# Patient Record
Sex: Male | Born: 1960 | Race: Black or African American | Hispanic: No | Marital: Married | Smoking: Former smoker
Health system: Southern US, Community
[De-identification: ages and names within clinical notes are randomized; demographics above are authoritative.]

## PROBLEM LIST (undated history)

## (undated) DIAGNOSIS — R030 Elevated blood-pressure reading, without diagnosis of hypertension: Secondary | ICD-10-CM

## (undated) DIAGNOSIS — G4730 Sleep apnea, unspecified: Secondary | ICD-10-CM

## (undated) HISTORY — DX: Elevated blood-pressure reading, without diagnosis of hypertension: R03.0

## (undated) HISTORY — PX: NO PAST SURGERIES: SHX2092

## (undated) HISTORY — DX: Sleep apnea, unspecified: G47.30

---

## 2004-05-17 ENCOUNTER — Emergency Department (HOSPITAL_COMMUNITY): Admission: EM | Admit: 2004-05-17 | Discharge: 2004-05-17 | Payer: Self-pay | Admitting: Family Medicine

## 2006-10-05 ENCOUNTER — Encounter: Admission: RE | Admit: 2006-10-05 | Discharge: 2006-10-05 | Payer: Self-pay | Admitting: Internal Medicine

## 2008-02-23 IMAGING — US US RENAL
1 series · 14 of 25 positions shown · non-contrast
Comparison: None.

CLINICAL DATA: 45 year old male with hematuria.  
 RENAL ULTRASOUND:
TECHNIQUE: Complete ultrasound examination of the urinary tract was performed including evaluation of the kidneys, renal collecting systems, and urinary bladder.

[Series 1: unknown · 0.33mm/px · 14 of 42 slices shown]
[im 1/42]
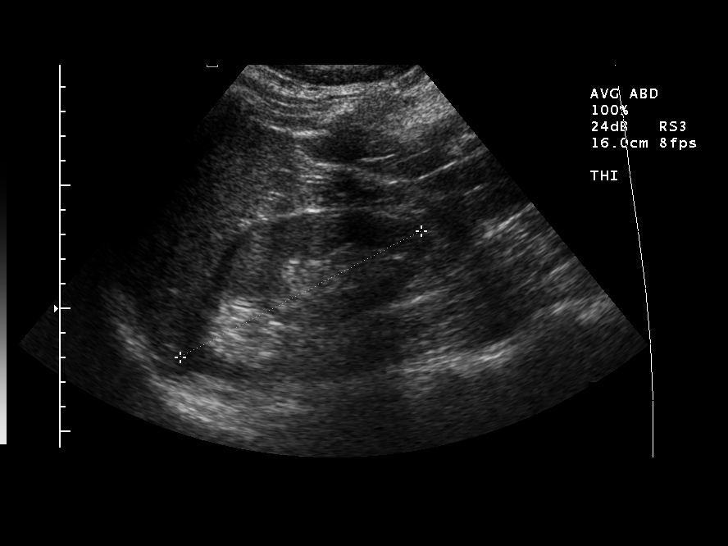
[im 4/42]
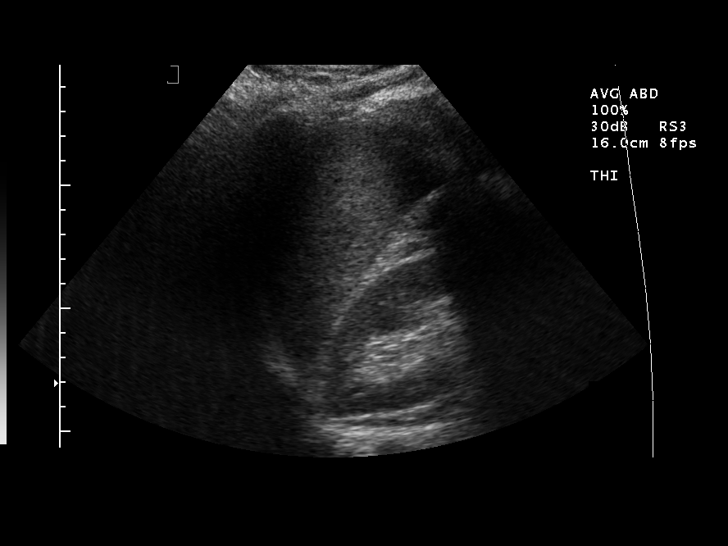
[im 7/42]
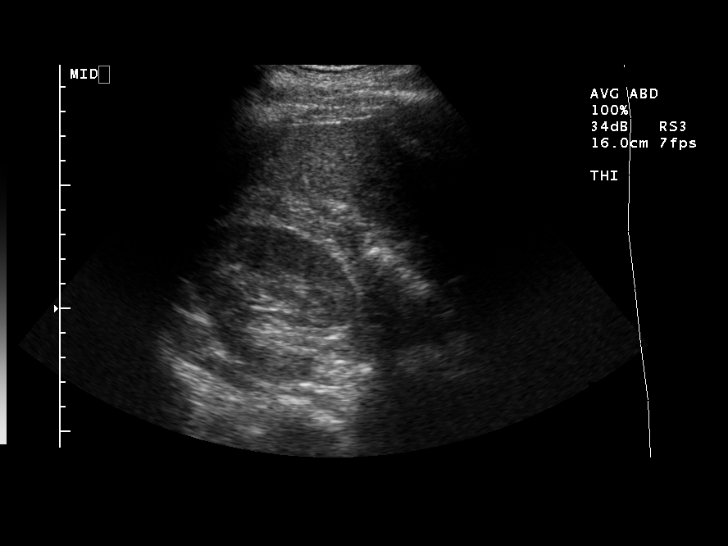
[im 11/42]
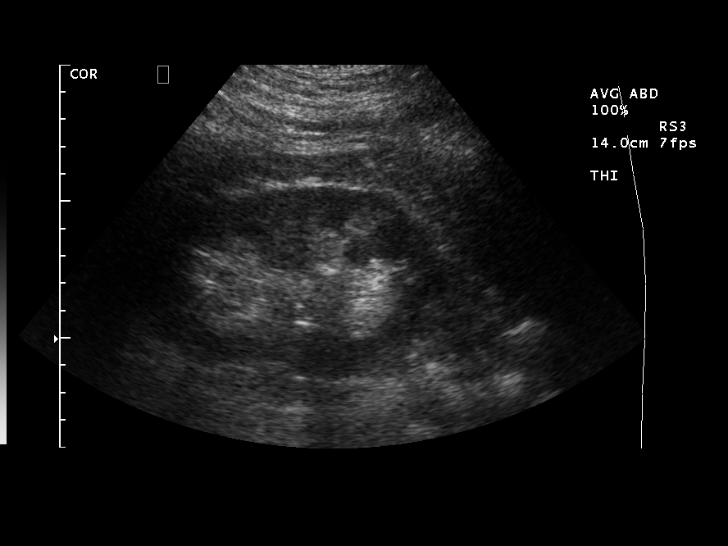
[im 14/42]
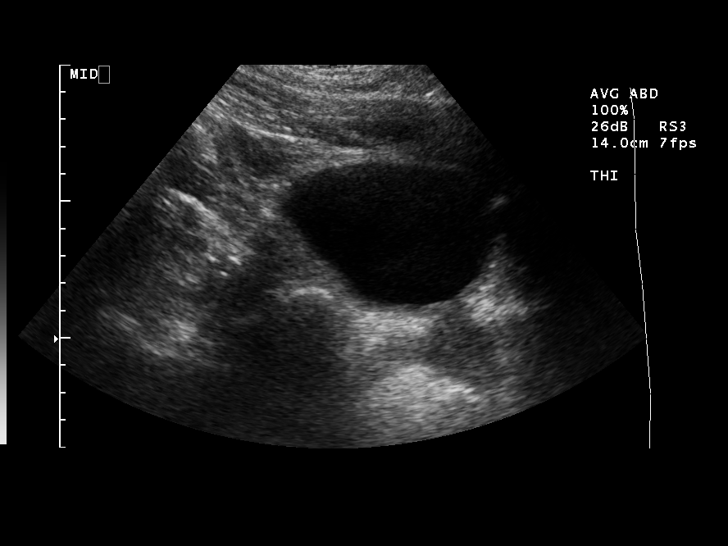
[im 16/42]
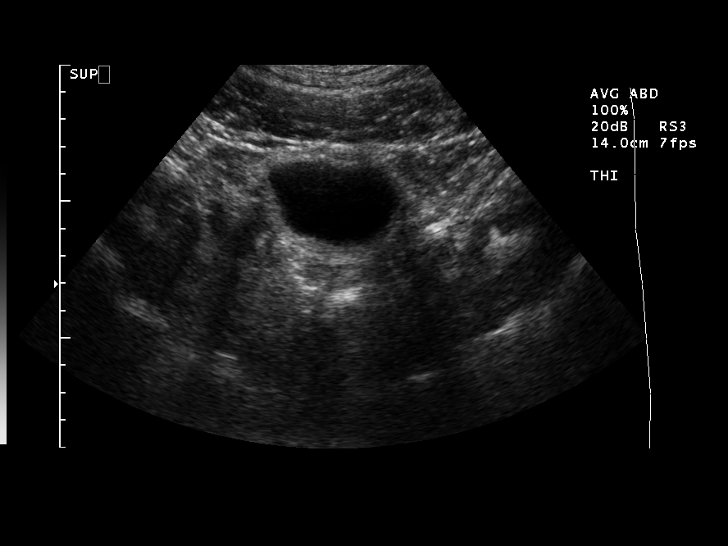
[im 19/42]
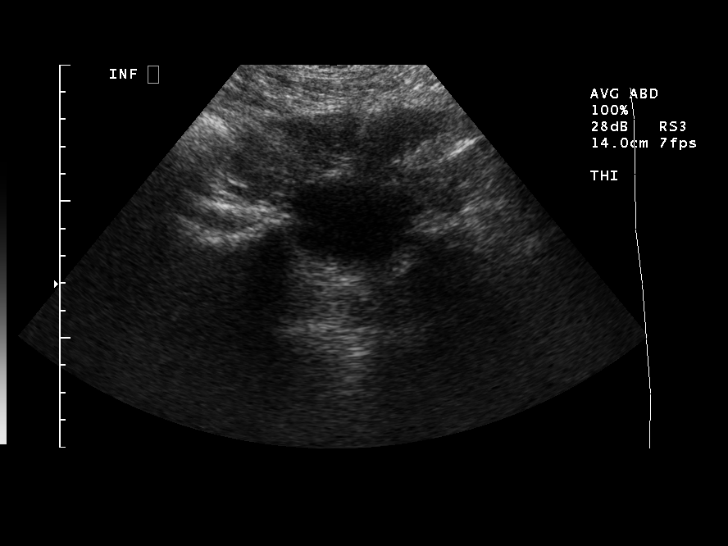
[im 23/42]
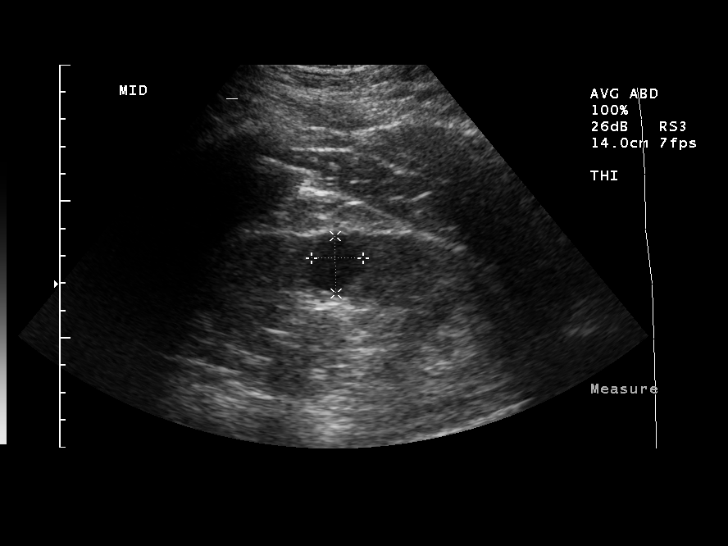
[im 26/42]
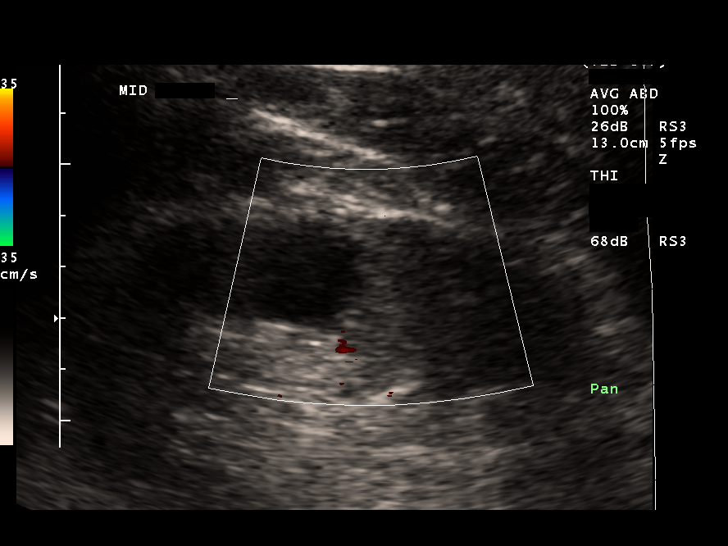
[im 28/42]
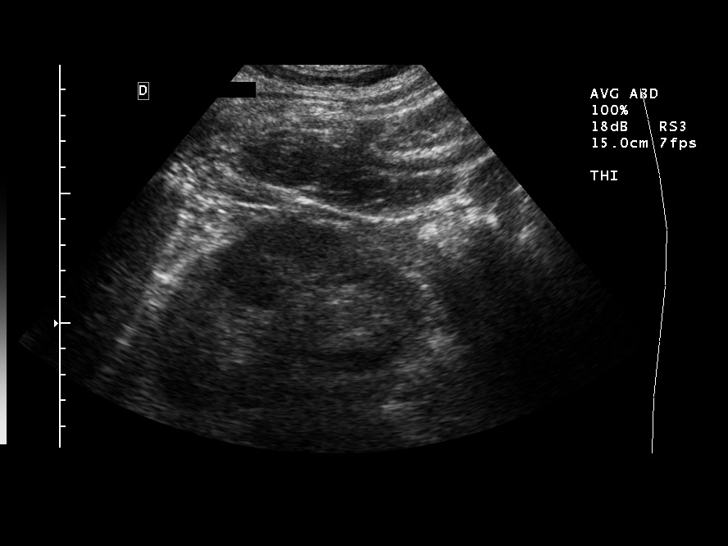
[im 31/42]
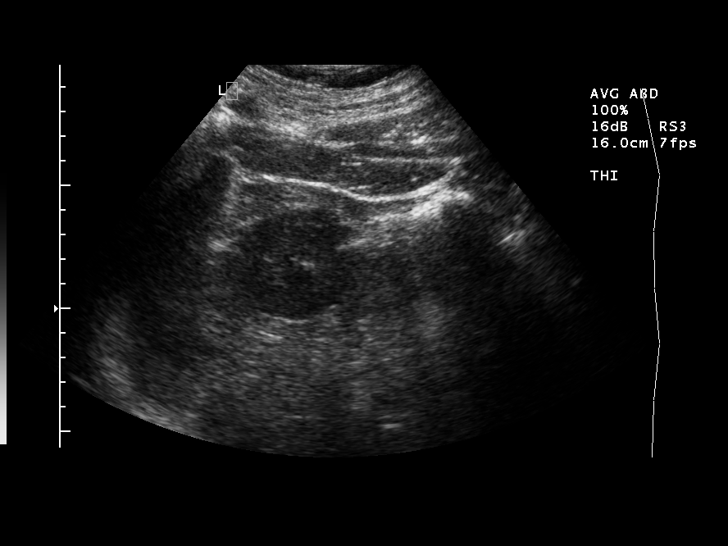
[im 35/42]
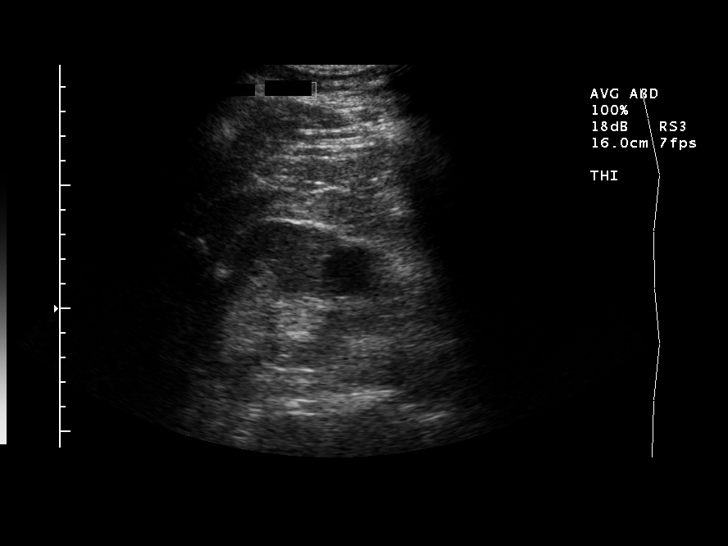
[im 38/42]
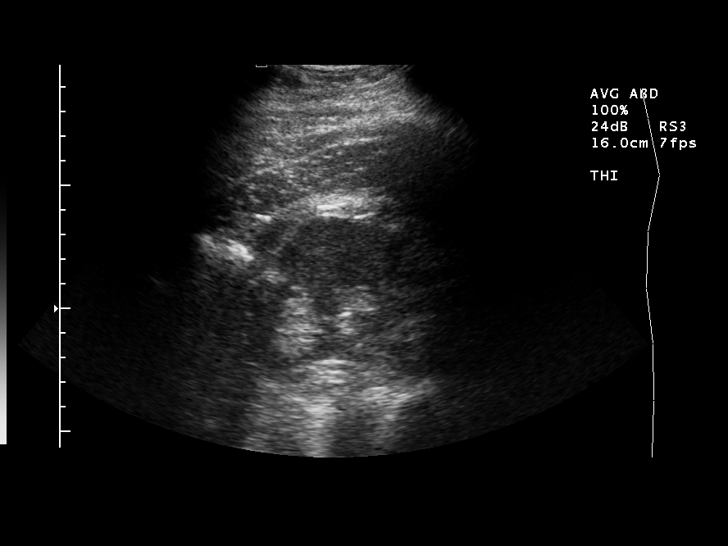
[im 42/42]
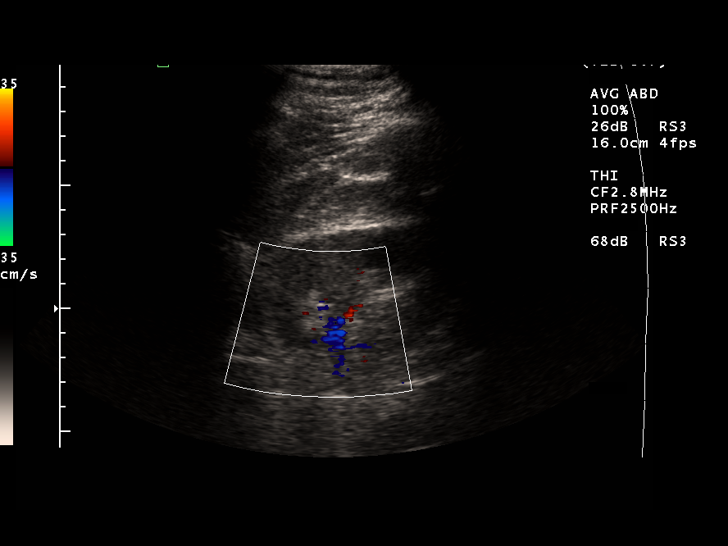

[14 of 25 positions shown; findings below may reference images not displayed]

FINDINGS: Right kidney appears normal measuring 11cm.  The left kidney measures 11.6cm.   No renal obstruction, hydronephrosis, or perinephric fluid collection.  The left kidney contains a mid to upper pole hypoechoic cyst measuring 19 x 21 x 22mm.  Imaging of the bladder is unremarkable.
IMPRESSION: 1.  2cm left renal cyst.  No acute finding by ultrasound.

## 2010-08-10 ENCOUNTER — Encounter: Payer: Self-pay | Admitting: Internal Medicine

## 2015-03-06 ENCOUNTER — Encounter (HOSPITAL_BASED_OUTPATIENT_CLINIC_OR_DEPARTMENT_OTHER): Payer: Self-pay

## 2015-03-06 ENCOUNTER — Emergency Department (HOSPITAL_BASED_OUTPATIENT_CLINIC_OR_DEPARTMENT_OTHER)
Admission: EM | Admit: 2015-03-06 | Discharge: 2015-03-06 | Discharge status: Against Medical Advice | Payer: BLUE CROSS/BLUE SHIELD | Attending: Emergency Medicine | Admitting: Emergency Medicine

## 2015-03-06 ENCOUNTER — Emergency Department (HOSPITAL_BASED_OUTPATIENT_CLINIC_OR_DEPARTMENT_OTHER): Payer: BLUE CROSS/BLUE SHIELD

## 2015-03-06 DIAGNOSIS — Z720 Tobacco use: Secondary | ICD-10-CM | POA: Insufficient documentation

## 2015-03-06 DIAGNOSIS — R202 Paresthesia of skin: Secondary | ICD-10-CM | POA: Diagnosis not present

## 2015-03-06 DIAGNOSIS — R079 Chest pain, unspecified: Secondary | ICD-10-CM | POA: Diagnosis not present

## 2015-03-06 LAB — BASIC METABOLIC PANEL
ANION GAP: 9 (ref 5–15)
BUN: 11 mg/dL (ref 6–20)
CHLORIDE: 106 mmol/L (ref 101–111)
CO2: 25 mmol/L (ref 22–32)
Calcium: 8.9 mg/dL (ref 8.9–10.3)
Creatinine, Ser: 0.73 mg/dL (ref 0.61–1.24)
GFR calc non Af Amer: >60 mL/min (ref >60)
Glucose, Bld: 92 mg/dL (ref 65–99)
POTASSIUM: 3.7 mmol/L (ref 3.5–5.1)
Sodium: 140 mmol/L (ref 135–145)

## 2015-03-06 LAB — CBC
HCT: 41.9 % (ref 39.0–52.0)
Hemoglobin: 14 g/dL (ref 13.0–17.0)
MCH: 32.4 pg (ref 26.0–34.0)
MCHC: 33.4 g/dL (ref 30.0–36.0)
MCV: 97 fL (ref 78.0–100.0)
Platelets: 252 10*3/uL (ref 150–400)
RBC: 4.32 MIL/uL (ref 4.22–5.81)
RDW: 13.3 % (ref 11.5–15.5)
WBC: 5.6 10*3/uL (ref 4.0–10.5)

## 2015-03-06 LAB — TROPONIN I

## 2015-03-06 NOTE — ED Notes (Signed)
Pt c/o lt side chest pain radiating down lt arm; denies n/v, SOB, diaphoresis; no distress and no pain at this time; pt states he is under a lot of stress

## 2015-03-06 NOTE — Discharge Instructions (Signed)
Chest Pain (Nonspecific) °It is often hard to give a specific diagnosis for the cause of chest pain. There is always a chance that your pain could be related to something serious, such as a heart attack or a blood clot in the lungs. You need to follow up with your health care provider for further evaluation. °CAUSES  °· Heartburn. °· Pneumonia or bronchitis. °· Anxiety or stress. °· Inflammation around your heart (pericarditis) or lung (pleuritis or pleurisy). °· A blood clot in the lung. °· A collapsed lung (pneumothorax). It can develop suddenly on its own (spontaneous pneumothorax) or from trauma to the chest. °· Shingles infection (herpes zoster virus). °The chest wall is composed of bones, muscles, and cartilage. Any of these can be the source of the pain. °· The bones can be bruised by injury. °· The muscles or cartilage can be strained by coughing or overwork. °· The cartilage can be affected by inflammation and become sore (costochondritis). °DIAGNOSIS  °Lab tests or other studies may be needed to find the cause of your pain. Your health care provider may have you take a test called an ambulatory electrocardiogram (ECG). An ECG records your heartbeat patterns over a 24-hour period. You may also have other tests, such as: °· Transthoracic echocardiogram (TTE). During echocardiography, sound waves are used to evaluate how blood flows through your heart. °· Transesophageal echocardiogram (TEE). °· Cardiac monitoring. This allows your health care provider to monitor your heart rate and rhythm in real time. °· Holter monitor. This is a portable device that records your heartbeat and can help diagnose heart arrhythmias. It allows your health care provider to track your heart activity for several days, if needed. °· Stress tests by exercise or by giving medicine that makes the heart beat faster. °TREATMENT  °· Treatment depends on what may be causing your chest pain. Treatment may include: °¨ Acid blockers for  heartburn. °¨ Anti-inflammatory medicine. °¨ Pain medicine for inflammatory conditions. °¨ Antibiotics if an infection is present. °· You may be advised to change lifestyle habits. This includes stopping smoking and avoiding alcohol, caffeine, and chocolate. °· You may be advised to keep your head raised (elevated) when sleeping. This reduces the chance of acid going backward from your stomach into your esophagus. °Most of the time, nonspecific chest pain will improve within 2-3 days with rest and mild pain medicine.  °HOME CARE INSTRUCTIONS  °· If antibiotics were prescribed, take them as directed. Finish them even if you start to feel better. °· For the next few days, avoid physical activities that bring on chest pain. Continue physical activities as directed. °· Do not use any tobacco products, including cigarettes, chewing tobacco, or electronic cigarettes. °· Avoid drinking alcohol. °· Only take medicine as directed by your health care provider. °· Follow your health care provider's suggestions for further testing if your chest pain does not go away. °· Keep any follow-up appointments you made. If you do not go to an appointment, you could develop lasting (chronic) problems with pain. If there is any problem keeping an appointment, call to reschedule. °SEEK MEDICAL CARE IF:  °· Your chest pain does not go away, even after treatment. °· You have a rash with blisters on your chest. °· You have a fever. °SEEK IMMEDIATE MEDICAL CARE IF:  °· You have increased chest pain or pain that spreads to your arm, neck, jaw, back, or abdomen. °· You have shortness of breath. °· You have an increasing cough, or you cough   up blood. °· You have severe back or abdominal pain. °· You feel nauseous or vomit. °· You have severe weakness. °· You faint. °· You have chills. °This is an emergency. Do not wait to see if the pain will go away. Get medical help at once. Call your local emergency services (911 in [place] Do not drive  yourself to the hospital. °MAKE SURE YOU:  °· Understand these instructions. °· Will watch your condition. °· Will get help right away if you are not doing well or get worse. °Document Released: 04/15/2005 Document Revised: 07/11/2013 Document Reviewed: 02/09/2008 °[employer]® Patient Information ©2015 [employer]. This information is not intended to replace advice given to you by your health care provider. Make sure you discuss any questions you have with your health care provider. ° ° °Discharge Against Medical Advice °I am signing this paper to show that I am leaving this hospital or health care center of my own free will. It is done against all medical advice. In doing so, I am releasing this hospital or health care center and the attending physicians from any and all claims that I may want to make. °I understand that further care has been recommended. My condition may worsen. This could cause me further bodily injury, illness, or even death. I do know that the medical staff has fully explained to me the risk that I am taking in leaving against medical advice. °Document Released: 07/06/2005 Document Revised: 09/28/2011 Document Reviewed: 12/21/2006 °[employer]® Patient Information ©2015 [employer]. This information is not intended to replace advice given to you by your health care provider. Make sure you discuss any questions you have with your health care provider. ° °

## 2015-03-06 NOTE — ED Provider Notes (Signed)
TIME SEEN: 4:10 AM  CHIEF COMPLAINT: Chest pain  HPI: Patient is a 53 year old male with history of tobacco use who presents to the emergency department with 2 weeks of intermittent left-sided chest pain. He describes it as feeling like "a pulled muscle". He describes some tingling into his left arm. Denies shortness of breath, nausea, vomiting, diaphoresis or dizziness. He denies that his pain is exertional. 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 States symptoms appear at rest. No other aggravating or relieving factors. Last episode of chest pain was 11:30 PM last night. Denies the pain is pleuritic or associated with food. States he never goes on long trips he gets another track frequently. No lower extremity swelling or pain. No recent surgery, fracture, trauma, hospitalization. Does have a father who had an MI in his early 68s. Denies fever or cough. States he has had a stress test approximately 10 years ago.  ROS: See HPI Constitutional: no fever  Eyes: no drainage  ENT: no runny nose   Cardiovascular:   chest pain  Resp: no SOB  GI: no vomiting GU: no dysuria Integumentary: no rash  Allergy: no hives  Musculoskeletal: no leg swelling  Neurological: no slurred speech ROS otherwise negative  PAST MEDICAL HISTORY/PAST SURGICAL HISTORY:  History reviewed. No pertinent past medical history.  MEDICATIONS:  Prior to Admission medications   Not on File    ALLERGIES:  No Known Allergies  SOCIAL HISTORY:  Social History  Substance Use Topics  . Smoking status: Current Every Day Smoker  . Smokeless tobacco: Not on file  . Alcohol Use: Yes    FAMILY HISTORY: No family history on file.  EXAM: BP 140/99 mmHg  Pulse 78  Temp(Src) 98.8 F (37.1 C) (Oral)  Resp 18  Ht  (1.727 m)  Wt 230 lb (104.327 kg)  BMI 34.98 kg/m2  SpO2 98% CONSTITUTIONAL: Alert and oriented and responds  appropriately to questions. Well-appearing; well-nourished HEAD: Normocephalic EYES: Conjunctivae clear, PERRL ENT: normal nose; no rhinorrhea; moist mucous membranes; pharynx without lesions noted NECK: Supple, no meningismus, no LAD  CARD: RRR; S1 and S2 appreciated; no murmurs, no clicks, no rubs, no gallops RESP: Normal chest excursion without splinting or tachypnea; breath sounds clear and equal bilaterally; no wheezes, no rhonchi, no rales, no hypoxia or respiratory distress, speaking full sentences CHEST:  Chest wall is nontender to palpation without crepitus or ecchymosis or deformity ABD/GI: Normal bowel sounds; non-distended; soft, non-tender, no rebound, no guarding, no peritoneal signs BACK:  The back appears normal and is non-tender to palpation, there is no CVA tenderness EXT: Normal ROM in all joints; non-tender to palpation; no edema; normal capillary refill; no cyanosis, no calf tenderness or swelling    SKIN: Normal color for age and race; warm NEURO: Moves all extremities equally, sensation to light touch intact diffusely, cranial nerves II through XII intact PSYCH: The patient's mood and manner are appropriate. Grooming and personal hygiene are appropriate.  MEDICAL DECISION MAKING: Patient here with complaints of chest pain with tingling into his left arm without other associated symptoms intermittent over the past 2 weeks. Pain is not exertional or pleuritic. His heart score is 2.  EKG shows nonspecific ST and T-wave changes with no old for comparison. We'll obtain cardiac labs, chest x-ray. He is currently asymptomatic.  ED PROGRESS: Patient's workup has been unremarkable including negative troponin, negative chest x-ray. Have recommended  he stay for a second set of cardiac enzymes the patient states he wants to go home. Have discussed with him risks of not staying for further evaluation including death, significant morbidity, continued symptoms. He is oriented 3 and has  capacity to make this decision. We'll have him sign out AGAINST MEDICAL ADVICE. He states he would like to follow-up with his primary care doctor today may not have encouraged him to do so. Discussed strict return precautions. He verbalizes understanding is comfortable with this plan.     EKG Interpretation  Date/Time:  Wednesday March 06 2015 04:13:23 EDT Ventricular Rate:  75 PR Interval:  162 QRS Duration: 90 QT Interval:  396 QTC Calculation: 442 R Axis:   71 Text Interpretation:  Normal sinus rhythm Nonspecific ST and T wave abnormality Abnormal ECG No old tracing to compare Confirmed by WARD,  DO, KRISTEN [phone]) on 03/06/2015 4:16:07 AM         Layla Maw Ward, DO 03/06/15 [id number]

## 2015-10-23 ENCOUNTER — Ambulatory Visit: Payer: BLUE CROSS/BLUE SHIELD | Admitting: Physician Assistant

## 2015-12-30 DIAGNOSIS — M1711 Unilateral primary osteoarthritis, right knee: Secondary | ICD-10-CM | POA: Insufficient documentation

## 2015-12-30 DIAGNOSIS — M659 Synovitis and tenosynovitis, unspecified: Secondary | ICD-10-CM | POA: Insufficient documentation

## 2016-07-24 IMAGING — DX DG CHEST 2V
2 series · 2 of 2 positions shown · non-contrast
Comparison: None.

CLINICAL DATA: Left-sided chest pain radiating down the left arm.

EXAM:
CHEST  2 VIEW

[chest pa]
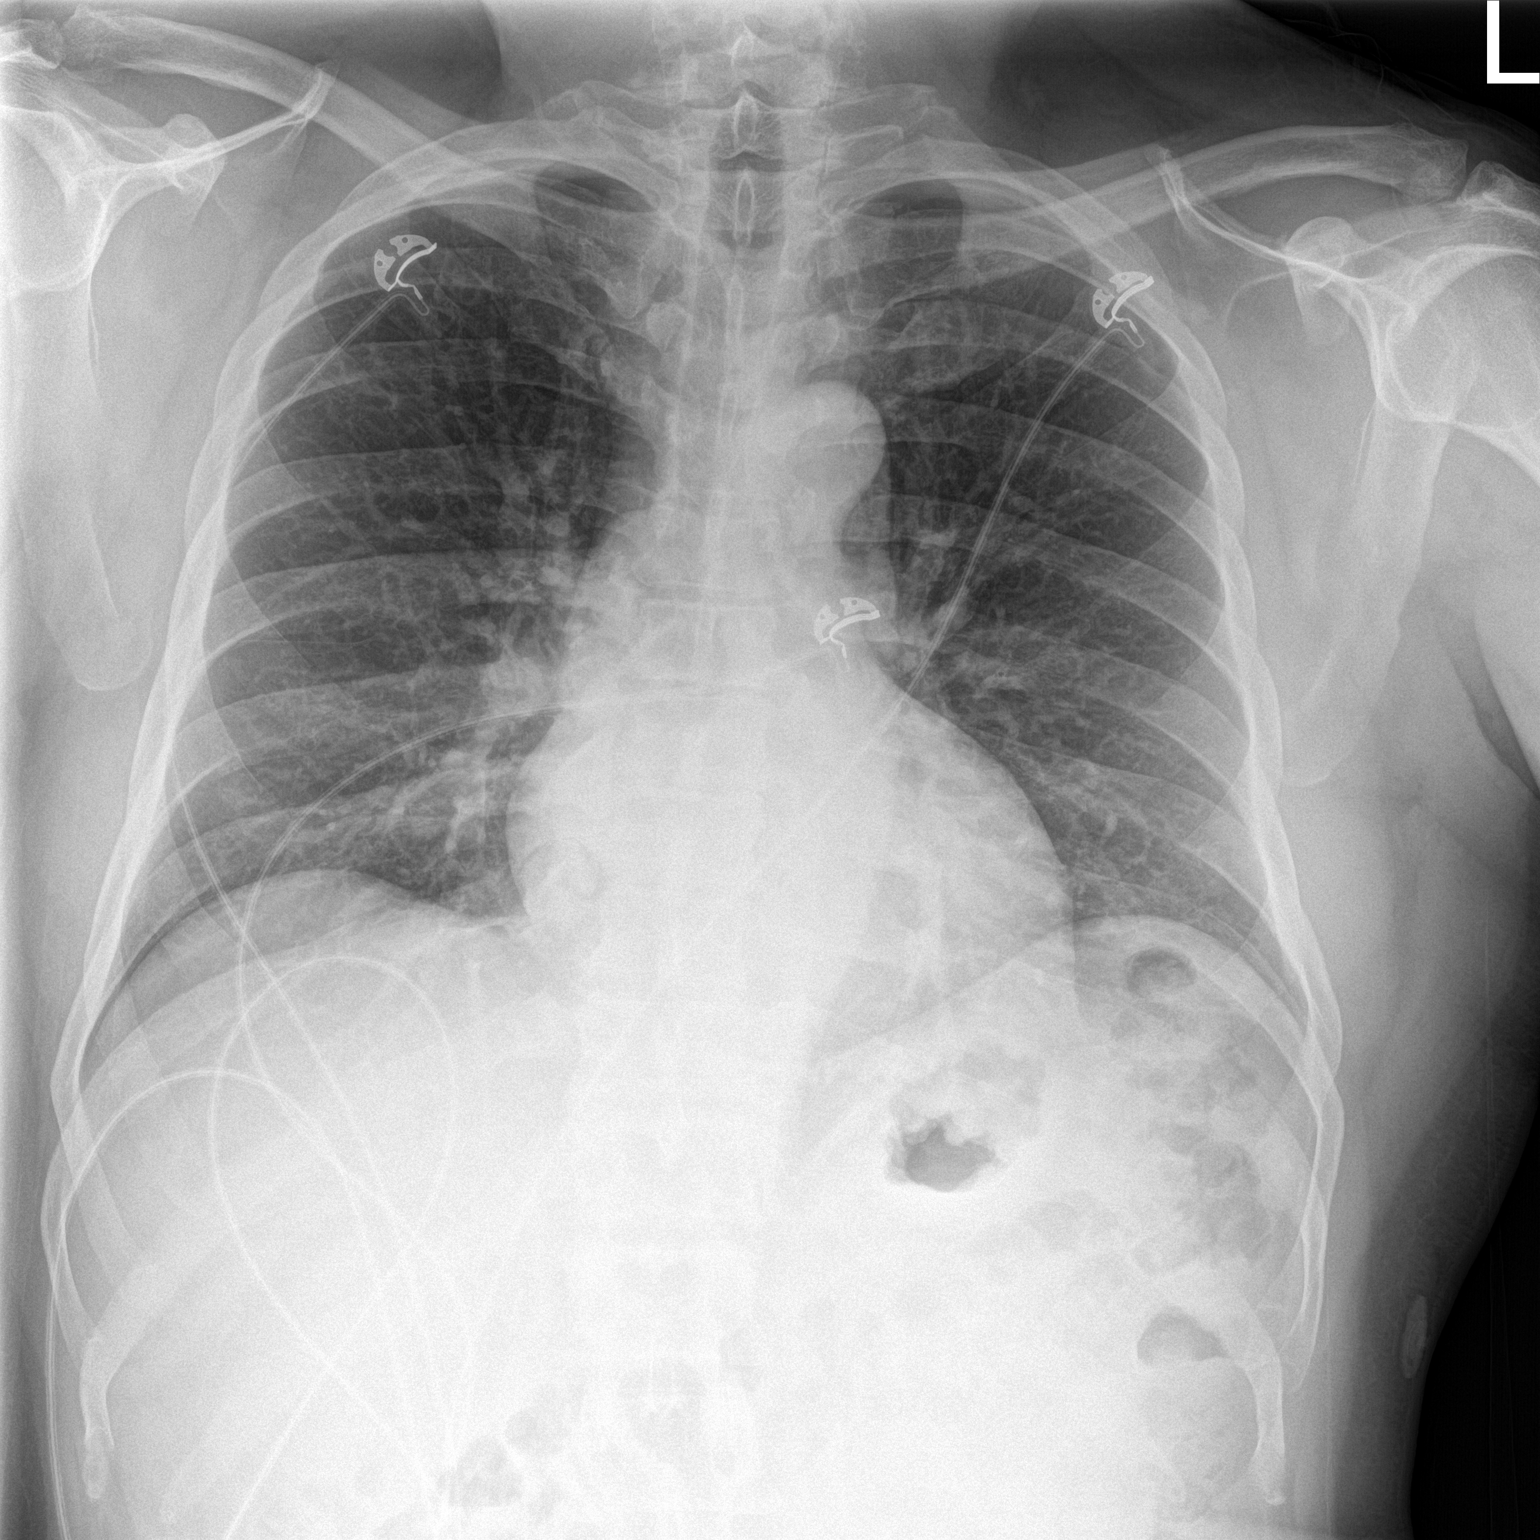

[chest lat]
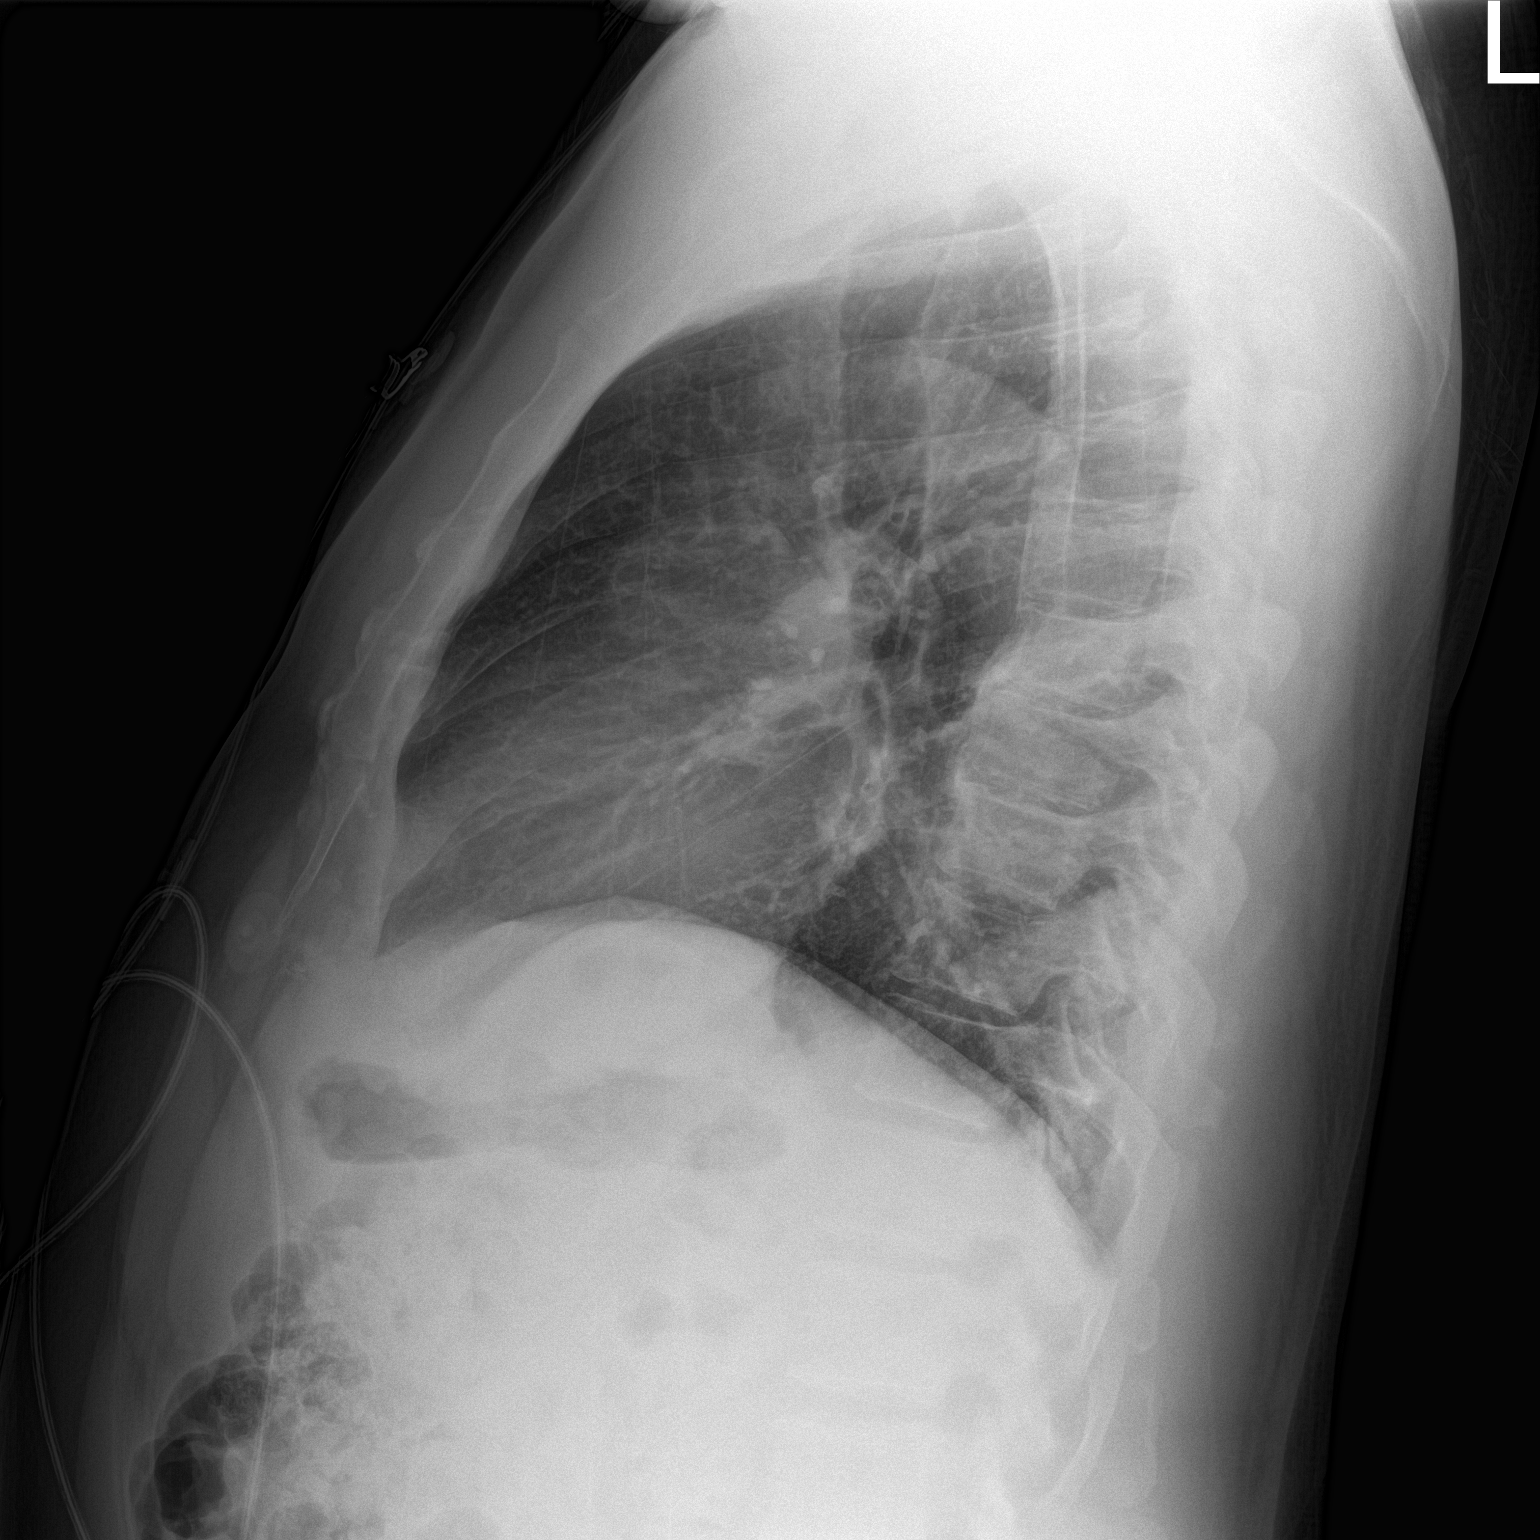

[2 of 2 positions shown; findings below may reference images not displayed]

FINDINGS: No cardiomegaly.  Mild aortic tortuosity.

There is no edema, consolidation, effusion, or pneumothorax.

Dense appearance of the mid thoracic spine in the lateral projection
correlates with lateral osteophytes on frontal imaging.
IMPRESSION: No active cardiopulmonary disease.

## 2016-11-06 ENCOUNTER — Ambulatory Visit (INDEPENDENT_AMBULATORY_CARE_PROVIDER_SITE_OTHER): Payer: BLUE CROSS/BLUE SHIELD | Admitting: Physician Assistant

## 2016-11-06 ENCOUNTER — Encounter: Payer: Self-pay | Admitting: Physician Assistant

## 2016-11-06 VITALS — BP 138/90 | HR 84 | Temp 97.9°F | Resp 16 | Ht 69.0 in | Wt 249.0 lb

## 2016-11-06 DIAGNOSIS — Z23 Encounter for immunization: Secondary | ICD-10-CM

## 2016-11-06 DIAGNOSIS — Z0001 Encounter for general adult medical examination with abnormal findings: Secondary | ICD-10-CM | POA: Diagnosis not present

## 2016-11-06 DIAGNOSIS — Z1211 Encounter for screening for malignant neoplasm of colon: Secondary | ICD-10-CM

## 2016-11-06 DIAGNOSIS — I10 Essential (primary) hypertension: Secondary | ICD-10-CM | POA: Insufficient documentation

## 2016-11-06 DIAGNOSIS — R030 Elevated blood-pressure reading, without diagnosis of hypertension: Secondary | ICD-10-CM | POA: Diagnosis not present

## 2016-11-06 DIAGNOSIS — Z0000 Encounter for general adult medical examination without abnormal findings: Secondary | ICD-10-CM | POA: Insufficient documentation

## 2016-11-06 NOTE — Assessment & Plan Note (Signed)
Depression screen negative. Health Maintenance reviewed -- TDap updated. Referral to GI for screening colonoscopy placed. Patient declines DRE for prostate exam.  The natural history of prostate cancer and ongoing controversy regarding screening and potential treatment outcomes of prostate cancer has been discussed with the patient. The meaning of a false positive PSA and a false negative PSA has been discussed. He indicates understanding of the limitations of this screening test and wishes  to proceed with screening PSA testing.  Preventive schedule discussed and handout given in AVS. Will obtain fasting labs today.

## 2016-11-06 NOTE — Progress Notes (Signed)
Patient presents to clinic today to establish care.  Acute Concerns: Patient endorses episodes of elevated BP over the past year, noted at DOT physical. States BP will be elevated on first check and then normal on recheck. Patient denies chest pain, palpitations, lightheadedness, dizziness, vision changes or frequent headaches. Diet -- Patient endorses well-balanced diet overall. Does eat good amount of vegetables in the diet. No fast food. Does fry occasionally but mostly baked meats. Drinks plenty of water per day. Exercise -- Stays active at work. Gets 16,000 steps per day. No other exercise at present.  Body mass index is 36.77 kg/m.  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 Denies history of alcohol abuse. Denies drinking as means of stress relief.   C -- Denies  A --  Denies  G -- Denies  E -- Denies  Health Maintenance: Immunizations -- Tetanus overdue. Patient agrees to this today.  Colonoscopy -- Overdue. Agrees to screening colonoscopy. Denies family history of colorectal Ca Prostate Cancer Screening -- Never done per patient. Denies family history of prostate Ca  Past Medical History:  Diagnosis Date  . Elevated BP without diagnosis of hypertension     Past Surgical History:  Procedure Laterality Date  . NO PAST SURGERIES      No current outpatient prescriptions on file prior to visit.   No current facility-administered medications on file prior to visit.    No Known Allergies  Family History  Problem Relation Age of Onset  . Hypertension Mother   . Hyperlipidemia Mother   . Hyperlipidemia Father   . Hypertension Father   . Heart attack Father 88  . Benign prostatic hyperplasia Brother   . Healthy Sister     x 5  . Healthy Brother     Social History   Social History  . Marital status: Married    Spouse name: Elnita Maxwell  .  Number of children: N/A  . Years of education: N/A   Occupational History  . Delivery Driver    Social History Main Topics  . Smoking status: Former Smoker    Packs/day: 0.25    Years: 5.00    Types: Cigarettes    Quit date: 09/22/2016  . Smokeless tobacco: Never Used  . 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 Drug use: No  . Sexual activity: Yes    Partners: Female    Birth control/ protection: None   Other Topics Concern  . Not on file   Social History Narrative  . No narrative on file   Review of Systems  Constitutional: Negative for fever and weight loss.  HENT: Negative for ear discharge, ear pain, hearing loss and tinnitus.   Eyes: Negative for blurred vision, double vision, photophobia and pain.  Respiratory: Negative for cough and shortness of breath.   Cardiovascular: Negative for chest pain and palpitations.  Gastrointestinal: Negative for abdominal pain, blood in stool, constipation, diarrhea, heartburn, melena, nausea and vomiting.  Genitourinary: Negative for dysuria, flank pain, frequency, hematuria and urgency.  Musculoskeletal: Negative for falls.  Neurological: Negative for dizziness, loss of consciousness and headaches.  Endo/Heme/Allergies: Negative for environmental allergies.  Psychiatric/Behavioral: Negative for depression, hallucinations, substance abuse and suicidal ideas. The patient is not nervous/anxious and does not have insomnia.    BP 138/90 (  BP Location: Left Arm, Cuff Size: Normal)   Pulse 84   Temp 97.9 F (36.6 C) (Oral)   Resp 16   Ht  (1.753 m)   Wt 249 lb (112.9 kg)   SpO2 98%   BMI 36.77 kg/m   Physical Exam  Constitutional: He is oriented to person, place, and time and well-developed, well-nourished, and in no distress.  HENT:  Head: Normocephalic and atraumatic.  Right Ear: External ear normal.  Left Ear: External ear normal.  Nose: Nose normal.  Mouth/Throat: Oropharynx is clear and moist. No  oropharyngeal exudate.  Eyes: Conjunctivae and EOM are normal. Pupils are equal, round, and reactive to light.  Neck: Neck supple. No thyromegaly present.  Cardiovascular: Normal rate, regular rhythm, normal heart sounds and intact distal pulses.   Pulmonary/Chest: Effort normal and breath sounds normal. No respiratory distress. He has no wheezes. He has no rales. He exhibits no tenderness.  Abdominal: Soft. Bowel sounds are normal. He exhibits no distension and no mass. There is no tenderness. There is no rebound and no guarding.  Genitourinary: Testes/scrotum normal and penis normal. No discharge found.  Genitourinary Comments: Defers DRE  Lymphadenopathy:    He has no cervical adenopathy.  Neurological: He is alert and oriented to person, place, and time.  Skin: Skin is warm and dry. No rash noted.  Psychiatric: Affect normal.  Vitals reviewed.  Assessment/Plan: Visit for preventive health examination Depression screen negative. Health Maintenance reviewed -- TDap updated. Referral to GI for screening colonoscopy placed. Patient declines DRE for prostate exam.  The natural history of prostate cancer and ongoing controversy regarding screening and potential treatment outcomes of prostate cancer has been discussed with the patient. The meaning of a false positive PSA and a false negative PSA has been discussed. He indicates understanding of the limitations of this screening test and wishes  to proceed with screening PSA testing.  Preventive schedule discussed and handout given in AVS. Will obtain fasting labs today.   Elevated BP without diagnosis of hypertension Start DASH diet. Patient to cut back alcohol consumption in half. He agrees to this. Exercise recommendations reviewed. Follow-up 2 weeks for reassessment. If staying elevated, will start medication while continuing TLC.    Piedad Climes, PA-C

## 2016-11-06 NOTE — Patient Instructions (Signed)
Please return fasting for labs on Monday.  Our office will call you with your results unless you have chosen to receive results via MyChart.  If your blood work is normal we will follow-up each year for physicals and as scheduled for chronic medical problems.  If anything is abnormal we will treat accordingly and get you in for a follow-up.  Please start the diet below. I want you staying active and I want you to cut alcohol intake during the week in half to help with BP. Follow-up in 2 weeks. If things are still elevated we will start a medication while continuing to work on lifestyle changes.   DASH Eating Plan DASH stands for "Dietary Approaches to Stop Hypertension." The DASH eating plan is a healthy eating plan that has been shown to reduce high blood pressure (hypertension). It may also reduce your risk for type 2 diabetes, heart disease, and stroke. The DASH eating plan may also help with weight loss. What are tips for following this plan? General guidelines   Avoid eating more than 2,300 mg (milligrams) of salt (sodium) a day. If you have hypertension, you may need to reduce your sodium intake to 1,500 mg a day.  Limit alcohol intake to no more than 1 drink a day for nonpregnant women and 2 drinks a day for men. One drink equals 12 oz of beer, 5 oz of wine, or 1 oz of hard liquor.  Work with your health care provider to maintain a healthy body weight or to lose weight. Ask what an ideal weight is for you.  Get at least 30 minutes of exercise that causes your heart to beat faster (aerobic exercise) most days of the week. Activities may include walking, swimming, or biking.  Work with your health care provider or diet and nutrition specialist (dietitian) to adjust your eating plan to your individual calorie needs. Reading food labels   Check food labels for the amount of sodium per serving. Choose foods with less than 5 percent of the Daily Value of sodium. Generally, foods with  less than 300 mg of sodium per serving fit into this eating plan.  To find whole grains, look for the word "whole" as the first word in the ingredient list. Shopping   Buy products labeled as "low-sodium" or "no salt added."  Buy fresh foods. Avoid canned foods and premade or frozen meals. Cooking   Avoid adding salt when cooking. Use salt-free seasonings or herbs instead of table salt or sea salt. Check with your health care provider or pharmacist before using salt substitutes.  Do not fry foods. Cook foods using healthy methods such as baking, boiling, grilling, and broiling instead.  Cook with heart-healthy oils, such as olive, canola, soybean, or sunflower oil. Meal planning    Eat a balanced diet that includes:  5 or more servings of fruits and vegetables each day. At each meal, try to fill half of your plate with fruits and vegetables.  Up to 6-8 servings of whole grains each day.  Less than 6 oz of lean meat, poultry, or fish each day. A 3-oz serving of meat is about the same size as a deck of cards. One egg equals 1 oz.  2 servings of low-fat dairy each day.  A serving of nuts, seeds, or beans 5 times each week.  Heart-healthy fats. Healthy fats called Omega-3 fatty acids are found in foods such as flaxseeds and coldwater fish, like sardines, salmon, and mackerel.  Limit how much  you eat of the following:  Canned or prepackaged foods.  Food that is high in trans fat, such as fried foods.  Food that is high in saturated fat, such as fatty meat.  Sweets, desserts, sugary drinks, and other foods with added sugar.  Full-fat dairy products.  Do not salt foods before eating.  Try to eat at least 2 vegetarian meals each week.  Eat more home-cooked food and less restaurant, buffet, and fast food.  When eating at a restaurant, ask that your food be prepared with less salt or no salt, if possible. What foods are recommended? The items listed may not be a complete  list. Talk with your dietitian about what dietary choices are best for you. Grains  Whole-grain or whole-wheat bread. Whole-grain or whole-wheat pasta. Brown rice. [place]. Bulgur. Whole-grain and low-sodium cereals. Pita bread. Low-fat, low-sodium crackers. Whole-wheat flour tortillas. Vegetables  Fresh or frozen vegetables (raw, steamed, roasted, or grilled). Low-sodium or reduced-sodium tomato and vegetable juice. Low-sodium or reduced-sodium tomato sauce and tomato paste. Low-sodium or reduced-sodium canned vegetables. Fruits  All fresh, dried, or frozen fruit. Canned fruit in natural juice (without added sugar). Meat and other protein foods  Skinless chicken or [place]. Ground chicken or [place]. Pork with fat trimmed off. Fish and seafood. Egg whites. Dried beans, peas, or lentils. Unsalted nuts, nut butters, and seeds. Unsalted canned beans. Lean cuts of beef with fat trimmed off. Low-sodium, lean deli meat. Dairy  Low-fat (1%) or fat-free (skim) milk. Fat-free, low-fat, or reduced-fat cheeses. Nonfat, low-sodium ricotta or cottage cheese. Low-fat or nonfat yogurt. Low-fat, low-sodium cheese. Fats and oils  Soft margarine without trans fats. Vegetable oil. Low-fat, reduced-fat, or light mayonnaise and salad dressings (reduced-sodium). Canola, safflower, olive, soybean, and sunflower oils. Avocado. Seasoning and other foods  Herbs. Spices. Seasoning mixes without salt. Unsalted popcorn and pretzels. Fat-free sweets. What foods are not recommended? The items listed may not be a complete list. Talk with your dietitian about what dietary choices are best for you. Grains  Baked goods made with fat, such as croissants, muffins, or some breads. Dry pasta or rice meal packs. Vegetables  Creamed or fried vegetables. Vegetables in a cheese sauce. Regular canned vegetables (not low-sodium or reduced-sodium). Regular canned tomato sauce and paste (not low-sodium or reduced-sodium). Regular  tomato and vegetable juice (not low-sodium or reduced-sodium). Angie Fava. Olives. Fruits  Canned fruit in a light or heavy syrup. Fried fruit. Fruit in cream or butter sauce. Meat and other protein foods  Fatty cuts of meat. Ribs. Fried meat. Berniece Salines. Sausage. Bologna and other processed lunch meats. Salami. Fatback. Hotdogs. Bratwurst. Salted nuts and seeds. Canned beans with added salt. Canned or smoked fish. Whole eggs or egg yolks. Chicken or [place] with skin. Dairy  Whole or 2% milk, cream, and half-and-half. Whole or full-fat cream cheese. Whole-fat or sweetened yogurt. Full-fat cheese. Nondairy creamers. Whipped toppings. Processed cheese and cheese spreads. Fats and oils  Butter. Stick margarine. Lard. Shortening. Ghee. Bacon fat. Tropical oils, such as coconut, palm kernel, or palm oil. Seasoning and other foods  Salted popcorn and pretzels. Onion salt, garlic salt, seasoned salt, table salt, and sea salt. Worcestershire sauce. Tartar sauce. Barbecue sauce. Teriyaki sauce. Soy sauce, including reduced-sodium. Steak sauce. Canned and packaged gravies. Fish sauce. Oyster sauce. Cocktail sauce. Horseradish that you find on the shelf. Ketchup. Mustard. Meat flavorings and tenderizers. Bouillon cubes. Hot sauce and Tabasco sauce. Premade or packaged marinades. Premade or packaged taco seasonings. Relishes. Regular salad dressings.  Where to find more information:  National Heart, Lung, and [place]: https://wilson-eaton.com/  American Heart Association: www.heart.org Summary  The DASH eating plan is a healthy eating plan that has been shown to reduce high blood pressure (hypertension). It may also reduce your risk for type 2 diabetes, heart disease, and stroke.  With the DASH eating plan, you should limit salt (sodium) intake to 2,300 mg a day. If you have hypertension, you may need to reduce your sodium intake to 1,500 mg a day.  When on the DASH eating plan, aim to eat more fresh fruits and  vegetables, whole grains, lean proteins, low-fat dairy, and heart-healthy fats.  Work with your health care provider or diet and nutrition specialist (dietitian) to adjust your eating plan to your individual calorie needs. This information is not intended to replace advice given to you by your health care provider. Make sure you discuss any questions you have with your health care provider. Document Released: 06/25/2011 Document Revised: 06/29/2016 Document Reviewed: 06/29/2016 Elsevier Interactive Patient Education  2017 [place] Years, Male Preventive care refers to lifestyle choices and visits with your health care provider that can promote health and wellness. What does preventive care include?  A yearly physical exam. This is also called an annual well check.  Dental exams once or twice a year.  Routine eye exams. Ask your health care provider how often you should have your eyes checked.  Personal lifestyle choices, including:  Daily care of your teeth and gums.  Regular physical activity.  Eating a healthy diet.  Avoiding tobacco and drug use.  Limiting alcohol use.  Practicing safe sex.  Taking low-dose aspirin every day starting at age 40. What happens during an annual well check? The services and screenings done by your health care provider during your annual well check will depend on your age, overall health, lifestyle risk factors, and family history of disease. Counseling  Your health care provider may ask you questions about your:  Alcohol use.  Tobacco use.  Drug use.  Emotional well-being.  Home and relationship well-being.  Sexual activity.  Eating habits.  Work and work Statistician. Screening  You may have the following tests or measurements:  Height, weight, and BMI.  Blood pressure.  Lipid and cholesterol levels. These may be checked every 5 years, or more frequently if you are over 24 years old.  Skin  check.  Lung cancer screening. You may have this screening every year starting at age 74 if you have a 30-pack-year history of smoking and currently smoke or have quit within the past 15 years.  Fecal occult blood test (FOBT) of the stool. You may have this test every year starting at age 25.  Flexible sigmoidoscopy or colonoscopy. You may have a sigmoidoscopy every 5 years or a colonoscopy every 10 years starting at age 38.  Prostate cancer screening. Recommendations will vary depending on your family history and other risks.  Hepatitis C blood test.  Hepatitis B blood test.  Sexually transmitted disease (STD) testing.  Diabetes screening. This is done by checking your blood sugar (glucose) after you have not eaten for a while (fasting). You may have this done every 1-3 years. Discuss your test results, treatment options, and if necessary, the need for more tests with your health care provider. Vaccines  Your health care provider may recommend certain vaccines, such as:  Influenza vaccine. This is recommended every year.  Tetanus, diphtheria, and acellular pertussis (Tdap,  Td) vaccine. You may need a Td booster every 10 years.  Varicella vaccine. You may need this if you have not been vaccinated.  Zoster vaccine. You may need this after age 24.  Measles, mumps, and rubella (MMR) vaccine. You may need at least one dose of MMR if you were born in 1957 or later. You may also need a second dose.  Pneumococcal 13-valent conjugate (PCV13) vaccine. You may need this if you have certain conditions and have not been vaccinated.  Pneumococcal polysaccharide (PPSV23) vaccine. You may need one or two doses if you smoke cigarettes or if you have certain conditions.  Meningococcal vaccine. You may need this if you have certain conditions.  Hepatitis A vaccine. You may need this if you have certain conditions or if you travel or work in places where you may be exposed to hepatitis  A.  Hepatitis B vaccine. You may need this if you have certain conditions or if you travel or work in places where you may be exposed to hepatitis B.  Haemophilus influenzae type b (Hib) vaccine. You may need this if you have certain risk factors. Talk to your health care provider about which screenings and vaccines you need and how often you need them. This information is not intended to replace advice given to you by your health care provider. Make sure you discuss any questions you have with your health care provider. Document Released: 08/02/2015 Document Revised: 03/25/2016 Document Reviewed: 05/07/2015 Elsevier Interactive Patient Education  2017 [employer].

## 2016-11-06 NOTE — Assessment & Plan Note (Signed)
Start DASH diet. Patient to cut back alcohol consumption in half. He agrees to this. Exercise recommendations reviewed. Follow-up 2 weeks for reassessment. If staying elevated, will start medication while continuing TLC.

## 2016-11-06 NOTE — Progress Notes (Signed)
Pre visit review using our clinic review tool, if applicable. No additional management support is needed unless otherwise documented below in the visit note. 

## 2016-11-09 ENCOUNTER — Encounter: Payer: Self-pay | Admitting: Emergency Medicine

## 2016-11-09 ENCOUNTER — Other Ambulatory Visit (INDEPENDENT_AMBULATORY_CARE_PROVIDER_SITE_OTHER): Payer: BLUE CROSS/BLUE SHIELD

## 2016-11-09 DIAGNOSIS — R030 Elevated blood-pressure reading, without diagnosis of hypertension: Secondary | ICD-10-CM

## 2016-11-09 DIAGNOSIS — Z0000 Encounter for general adult medical examination without abnormal findings: Secondary | ICD-10-CM

## 2016-11-09 LAB — COMPREHENSIVE METABOLIC PANEL
ALT: 20 U/L (ref 0–53)
AST: 25 U/L (ref 0–37)
Albumin: 4 g/dL (ref 3.5–5.2)
Alkaline Phosphatase: 79 U/L (ref 39–117)
BILIRUBIN TOTAL: 0.2 mg/dL (ref 0.2–1.2)
BUN: 13 mg/dL (ref 6–23)
CO2: 27 mEq/L (ref 19–32)
Calcium: 8.8 mg/dL (ref 8.4–10.5)
Chloride: 105 mEq/L (ref 96–112)
Creatinine, Ser: 0.79 mg/dL (ref 0.40–1.50)
GFR: 131.5 mL/min (ref >60.00)
GLUCOSE: 98 mg/dL (ref 70–99)
POTASSIUM: 4 meq/L (ref 3.5–5.1)
SODIUM: 138 meq/L (ref 135–145)
TOTAL PROTEIN: 6.9 g/dL (ref 6.0–8.3)

## 2016-11-09 LAB — LIPID PANEL
CHOL/HDL RATIO: 3
Cholesterol: 138 mg/dL (ref 0–200)
HDL: 53.1 mg/dL (ref >39.00)
LDL Cholesterol: 55 mg/dL (ref 0–99)
NONHDL: 84.64
Triglycerides: 149 mg/dL (ref 0.0–149.0)
VLDL: 29.8 mg/dL (ref 0.0–40.0)

## 2016-11-09 LAB — CBC WITH DIFFERENTIAL/PLATELET
BASOS ABS: 0 10*3/uL (ref 0.0–0.1)
Basophils Relative: 0.6 % (ref 0.0–3.0)
Eosinophils Absolute: 0.2 10*3/uL (ref 0.0–0.7)
Eosinophils Relative: 3.4 % (ref 0.0–5.0)
HCT: 39.5 % (ref 39.0–52.0)
Hemoglobin: 13.1 g/dL (ref 13.0–17.0)
LYMPHS ABS: 2.8 10*3/uL (ref 0.7–4.0)
Lymphocytes Relative: 49.1 % — ABNORMAL HIGH (ref 12.0–46.0)
MCHC: 33.1 g/dL (ref 30.0–36.0)
MCV: 97 fl (ref 78.0–100.0)
MONO ABS: 0.5 10*3/uL (ref 0.1–1.0)
MONOS PCT: 9 % (ref 3.0–12.0)
NEUTROS ABS: 2.1 10*3/uL (ref 1.4–7.7)
NEUTROS PCT: 37.9 % — AB (ref 43.0–77.0)
PLATELETS: 288 10*3/uL (ref 150.0–400.0)
RBC: 4.07 Mil/uL — AB (ref 4.22–5.81)
RDW: 13.2 % (ref 11.5–15.5)
WBC: 5.7 10*3/uL (ref 4.0–10.5)

## 2016-11-09 LAB — PSA: PSA: 1.01 ng/mL (ref 0.10–4.00)

## 2016-11-09 LAB — HEMOGLOBIN A1C: Hgb A1c MFr Bld: 6.3 % (ref 4.6–6.5)

## 2016-12-08 ENCOUNTER — Encounter: Payer: Self-pay | Admitting: Physician Assistant

## 2017-10-14 ENCOUNTER — Encounter: Payer: Self-pay | Admitting: Physician Assistant

## 2017-10-14 ENCOUNTER — Other Ambulatory Visit: Payer: Self-pay

## 2017-10-14 ENCOUNTER — Ambulatory Visit (INDEPENDENT_AMBULATORY_CARE_PROVIDER_SITE_OTHER): Payer: PRIVATE HEALTH INSURANCE | Admitting: Physician Assistant

## 2017-10-14 ENCOUNTER — Encounter: Payer: Self-pay | Admitting: Gastroenterology

## 2017-10-14 VITALS — BP 140/90 | HR 72 | Temp 98.1°F | Resp 16 | Ht 69.0 in | Wt 239.0 lb

## 2017-10-14 DIAGNOSIS — J302 Other seasonal allergic rhinitis: Secondary | ICD-10-CM | POA: Diagnosis not present

## 2017-10-14 DIAGNOSIS — I10 Essential (primary) hypertension: Secondary | ICD-10-CM | POA: Diagnosis not present

## 2017-10-14 DIAGNOSIS — Z125 Encounter for screening for malignant neoplasm of prostate: Secondary | ICD-10-CM | POA: Insufficient documentation

## 2017-10-14 DIAGNOSIS — E669 Obesity, unspecified: Secondary | ICD-10-CM | POA: Diagnosis not present

## 2017-10-14 DIAGNOSIS — Z0000 Encounter for general adult medical examination without abnormal findings: Secondary | ICD-10-CM

## 2017-10-14 DIAGNOSIS — Z1211 Encounter for screening for malignant neoplasm of colon: Secondary | ICD-10-CM

## 2017-10-14 DIAGNOSIS — G4733 Obstructive sleep apnea (adult) (pediatric): Secondary | ICD-10-CM | POA: Diagnosis not present

## 2017-10-14 LAB — CBC WITH DIFFERENTIAL/PLATELET
BASOS ABS: 0 10*3/uL (ref 0.0–0.1)
Basophils Relative: 0.8 % (ref 0.0–3.0)
EOS ABS: 0.1 10*3/uL (ref 0.0–0.7)
EOS PCT: 1.4 % (ref 0.0–5.0)
HCT: 41.9 % (ref 39.0–52.0)
Hemoglobin: 14 g/dL (ref 13.0–17.0)
LYMPHS ABS: 2.7 10*3/uL (ref 0.7–4.0)
Lymphocytes Relative: 47 % — ABNORMAL HIGH (ref 12.0–46.0)
MCHC: 33.5 g/dL (ref 30.0–36.0)
MCV: 97.8 fl (ref 78.0–100.0)
MONO ABS: 0.6 10*3/uL (ref 0.1–1.0)
Monocytes Relative: 10.8 % (ref 3.0–12.0)
NEUTROS PCT: 40 % — AB (ref 43.0–77.0)
Neutro Abs: 2.3 10*3/uL (ref 1.4–7.7)
Platelets: 269 10*3/uL (ref 150.0–400.0)
RBC: 4.29 Mil/uL (ref 4.22–5.81)
RDW: 14.8 % (ref 11.5–15.5)
WBC: 5.8 10*3/uL (ref 4.0–10.5)

## 2017-10-14 LAB — COMPREHENSIVE METABOLIC PANEL
ALK PHOS: 70 U/L (ref 39–117)
ALT: 39 U/L (ref 0–53)
AST: 38 U/L — AB (ref 0–37)
Albumin: 3.9 g/dL (ref 3.5–5.2)
BUN: 11 mg/dL (ref 6–23)
CO2: 29 mEq/L (ref 19–32)
CREATININE: 0.79 mg/dL (ref 0.40–1.50)
Calcium: 9.2 mg/dL (ref 8.4–10.5)
Chloride: 102 mEq/L (ref 96–112)
GFR: 131.05 mL/min (ref >60.00)
GLUCOSE: 105 mg/dL — AB (ref 70–99)
POTASSIUM: 3.8 meq/L (ref 3.5–5.1)
SODIUM: 138 meq/L (ref 135–145)
Total Bilirubin: 0.4 mg/dL (ref 0.2–1.2)
Total Protein: 7.1 g/dL (ref 6.0–8.3)

## 2017-10-14 LAB — LIPID PANEL
Cholesterol: 153 mg/dL (ref 0–200)
HDL: 61.1 mg/dL (ref >39.00)
LDL Cholesterol: 73 mg/dL (ref 0–99)
NONHDL: 91.47
Total CHOL/HDL Ratio: 2
Triglycerides: 90 mg/dL (ref 0.0–149.0)
VLDL: 18 mg/dL (ref 0.0–40.0)

## 2017-10-14 LAB — PSA: PSA: 1.16 ng/mL (ref 0.10–4.00)

## 2017-10-14 LAB — HEMOGLOBIN A1C: HEMOGLOBIN A1C: 6.2 % (ref 4.6–6.5)

## 2017-10-14 MED ORDER — FLUTICASONE PROPIONATE 50 MCG/ACT NA SUSP: 2.0000 | Freq: Every day | NASAL | 6 refills | Status: DC

## 2017-10-14 NOTE — Assessment & Plan Note (Signed)
Depression screen negative. Health Maintenance reviewed -- Declines flu shot, HIV screen and Hep C screen. Agrees to Colonoscopy.. Preventive schedule discussed and handout given in AVS. Will obtain fasting labs today.

## 2017-10-14 NOTE — Assessment & Plan Note (Signed)
Dietary and exercise recommendations reviewed with patient to help with weight loss.

## 2017-10-14 NOTE — Assessment & Plan Note (Signed)
Referral placed back to Pulmonology for repeat study and CPAP titration. Discussed dietary and exercise recommendations for continued weight loss.

## 2017-10-14 NOTE — Assessment & Plan Note (Signed)
The natural history of prostate cancer and ongoing controversy regarding screening and potential treatment outcomes of prostate cancer has been discussed with the patient. The meaning of a false positive PSA and a false negative PSA has been discussed. He indicates understanding of the limitations of this screening test and wishes  to proceed with screening PSA testing.  

## 2017-10-14 NOTE — Patient Instructions (Addendum)
Please go to the lab for blood work.   Our office will call you with your results unless you have chosen to receive results via MyChart.  If your blood work is normal we will follow-up each year for physicals and as scheduled for chronic medical problems.  If anything is abnormal we will treat accordingly and get you in for a follow-up.  You will be contacted for a screening colonoscopy. You will also be contacted for an assessment with a sleep specialist regarding your sleep apnea and so we can get you set back up with your CPAP.  Follow the dietary recommendations below to help with BP. This will improve once we get CPAP restarted. Follow-up in 2 months for reassessment of blood pressure.    Preventive Care 40-64 Years, Male Preventive care refers to lifestyle choices and visits with your health care provider that can promote health and wellness. What does preventive care include?  A yearly physical exam. This is also called an annual well check.  Dental exams once or twice a year.  Routine eye exams. Ask your health care provider how often you should have your eyes checked.  Personal lifestyle choices, including: ? Daily care of your teeth and gums. ? Regular physical activity. ? Eating a healthy diet. ? Avoiding tobacco and drug use. ? Limiting alcohol use. ? Practicing safe sex. ? Taking low-dose aspirin every day starting at age 45. What happens during an annual well check? The services and screenings done by your health care provider during your annual well check will depend on your age, overall health, lifestyle risk factors, and family history of disease. Counseling Your health care provider may ask you questions about your:  Alcohol use.  Tobacco use.  Drug use.  Emotional well-being.  Home and relationship well-being.  Sexual activity.  Eating habits.  Work and work Statistician.  Screening You may have the following tests or measurements:  Height,  weight, and BMI.  Blood pressure.  Lipid and cholesterol levels. These may be checked every 5 years, or more frequently if you are over 35 years old.  Skin check.  Lung cancer screening. You may have this screening every year starting at age 3 if you have a 30-pack-year history of smoking and currently smoke or have quit within the past 15 years.  Fecal occult blood test (FOBT) of the stool. You may have this test every year starting at age 61.  Flexible sigmoidoscopy or colonoscopy. You may have a sigmoidoscopy every 5 years or a colonoscopy every 10 years starting at age 66.  Prostate cancer screening. Recommendations will vary depending on your family history and other risks.  Hepatitis C blood test.  Hepatitis B blood test.  Sexually transmitted disease (STD) testing.  Diabetes screening. This is done by checking your blood sugar (glucose) after you have not eaten for a while (fasting). You may have this done every 1-3 years.  Discuss your test results, treatment options, and if necessary, the need for more tests with your health care provider. Vaccines Your health care provider may recommend certain vaccines, such as:  Influenza vaccine. This is recommended every year.  Tetanus, diphtheria, and acellular pertussis (Tdap, Td) vaccine. You may need a Td booster every 10 years.  Varicella vaccine. You may need this if you have not been vaccinated.  Zoster vaccine. You may need this after age 98.  Measles, mumps, and rubella (MMR) vaccine. You may need at least one dose of MMR if you were  born in [birth year] or later. You may also need a second dose.  Pneumococcal 13-valent conjugate (PCV13) vaccine. You may need this if you have certain conditions and have not been vaccinated.  Pneumococcal polysaccharide (PPSV23) vaccine. You may need one or two doses if you smoke cigarettes or if you have certain conditions.  Meningococcal vaccine. You may need this if you have certain  conditions.  Hepatitis A vaccine. You may need this if you have certain conditions or if you travel or work in places where you may be exposed to hepatitis A.  Hepatitis B vaccine. You may need this if you have certain conditions or if you travel or work in places where you may be exposed to hepatitis B.  Haemophilus influenzae type b (Hib) vaccine. You may need this if you have certain risk factors.  Talk to your health care provider about which screenings and vaccines you need and how often you need them. This information is not intended to replace advice given to you by your health care provider. Make sure you discuss any questions you have with your health care provider. Document Released: 08/02/2015 Document Revised: 03/25/2016 Document Reviewed: 05/07/2015 Elsevier Interactive Patient Education  [employer].

## 2017-10-14 NOTE — Assessment & Plan Note (Signed)
Start Flonase to help with symptoms. If not resolving, will add on Claritin or Xyzal.

## 2017-10-14 NOTE — Assessment & Plan Note (Signed)
BP elevated at 140/90. Untreated OSA is likely contributor. This is being taking care of. Patient to start DASH diet and work hard on diet and exercise. Follow-up scheduled for reassessment. May have to add on Thiazide or CCB at next visit if still elevated. Fasting labs today.

## 2017-10-14 NOTE — Progress Notes (Signed)
Patient presents to clinic today for annual exam.  Patient is fasting for labs.  Diet -- Endorses well-balanced diet overall Exercise -- Stays very active at work, constantly doing heavy lifting and walking > 10,000 steps daily.  Body mass index is 35.29 kg/m.  Acute Concerns: Patient has noted issue with runny nose, pnd and nasal congestion since the weather change. Notes this issue every year from Winter to Spring. Denies fever, chills, sore throat, sinus pain, ear pain or tooth pain. Has not taken anything for symptoms.   Chronic Issues: OSA -- Diagnosed 6 years ago per patient. Has CPAP but has not been working in a year or so. Averages about 6 hours of sleep per night. Does not non-restful sleep. Some occasional snoring. Some apneic episodes noted waking him up from sleep.    Health Maintenance: Immunizations -- Tetanus up-to-date. Declines flu shot. Colonoscopy -- Due. No family history. Denies symptoms. Agrees to Colonoscopy.  Past Medical History:  Diagnosis Date  . Elevated BP without diagnosis of hypertension     Past Surgical History:  Procedure Laterality Date  . NO PAST SURGERIES      No current outpatient medications on file prior to visit.   No current facility-administered medications on file prior to visit.     No Known Allergies  Family History  Problem Relation Age of Onset  . Hypertension Mother   . Hyperlipidemia Mother   . Hyperlipidemia Father   . Hypertension Father   . Heart attack Father 72  . Benign prostatic hyperplasia Brother   . Healthy Sister        x 5  . Healthy Brother     Social History   Socioeconomic History  . Marital status: Married    Spouse name: Elnita Maxwell  . Number of children: Not on file  . Years of education: Not on file  . Highest education level: Not on file  Occupational History  . Occupation: Civil Service fast streamer  Social Needs  . Financial resource strain: Not on file  . Food insecurity:    Worry: Not on  file    Inability: Not on file  . Transportation needs:    Medical: Not on file    Non-medical: Not on file  Tobacco Use  . Smoking status: Former Smoker    Packs/day: 0.25    Years: 5.00    Pack years: 1.25    Types: Cigarettes    Last attempt to quit: 09/22/2016    Years since quitting: 1.0  . Smokeless tobacco: Never Used  Substance and Sexual Activity  . 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 Drug use: No  . Sexual activity: Yes    Partners: Female    Birth control/protection: None  Lifestyle  . Physical activity:    Days per week: Not on file    Minutes per session: Not on file  . Stress: Not on file  Relationships  . Social connections:    Talks on phone: Not on file    Gets together: Not on file    Attends religious service: Not on file    Active member of club or organization: Not on file    Attends meetings of clubs or organizations: Not on file    Relationship status: Not on file  . Intimate partner violence:    Fear of current or ex partner: Not on file    Emotionally abused: Not on file  Physically abused: Not on file    Forced sexual activity: Not on file  Other Topics Concern  . Not on file  Social History Narrative  . Not on file    Review of Systems  Constitutional: Negative for fever and weight loss.  HENT: Negative for ear discharge, ear pain, hearing loss and tinnitus.   Eyes: Negative for blurred vision, double vision, photophobia and pain.  Respiratory: Negative for cough and shortness of breath.   Cardiovascular: Negative for chest pain and palpitations.  Gastrointestinal: Negative for abdominal pain, blood in stool, constipation, diarrhea, heartburn, melena, nausea and vomiting.  Genitourinary: Negative for dysuria, flank pain, frequency, hematuria and urgency.  Musculoskeletal: Negative for falls.  Neurological: Negative for dizziness, loss of consciousness and headaches.  Endo/Heme/Allergies: Negative for  environmental allergies.  Psychiatric/Behavioral: Negative for depression, hallucinations, substance abuse and suicidal ideas. The patient is not nervous/anxious and does not have insomnia.    BP 140/90   Pulse 72   Temp 98.1 F (36.7 C) (Oral)   Resp 16   Ht 5\' 9"  (1.753 m)   Wt 239 lb (108.4 kg)   SpO2 98%   BMI 35.29 kg/m   Physical Exam  Constitutional: He is oriented to person, place, and time and well-developed, well-nourished, and in no distress.  HENT:  Head: Normocephalic and atraumatic.  Right Ear: External ear normal.  Left Ear: External ear normal.  Nose: Nose normal.  Mouth/Throat: Oropharynx is clear and moist. No oropharyngeal exudate.  Eyes: Pupils are equal, round, and reactive to light. Conjunctivae and EOM are normal.  Neck: Neck supple. No thyromegaly present.  Cardiovascular: Normal rate, regular rhythm, normal heart sounds and intact distal pulses.  Pulmonary/Chest: Effort normal and breath sounds normal. No respiratory distress. He has no wheezes. He has no rales. He exhibits no tenderness.  Abdominal: Soft. Bowel sounds are normal. He exhibits no distension and no mass. There is no tenderness. There is no rebound and no guarding.  Genitourinary: Testes/scrotum normal and penis normal. No discharge found.  Lymphadenopathy:    He has no cervical adenopathy.  Neurological: He is alert and oriented to person, place, and time.  Skin: Skin is warm and dry. No rash noted.  Psychiatric: Affect normal.  Vitals reviewed.  Assessment/Plan: OSA (obstructive sleep apnea) Referral placed back to Pulmonology for repeat study and CPAP titration. Discussed dietary and exercise recommendations for continued weight loss.   Seasonal allergic rhinitis Start Flonase to help with symptoms. If not resolving, will add on Claritin or Xyzal.  Hypertension BP elevated at 140/90. Untreated OSA is likely contributor. This is being taking care of. Patient to start DASH diet and  work hard on diet and exercise. Follow-up scheduled for reassessment. May have to add on Thiazide or CCB at next visit if still elevated. Fasting labs today.  Obesity (BMI 30-39.9) Dietary and exercise recommendations reviewed with patient to help with weight loss.  Prostate cancer screening The natural history of prostate cancer and ongoing controversy regarding screening and potential treatment outcomes of prostate cancer has been discussed with the patient. The meaning of a false positive PSA and a false negative PSA has been discussed. He indicates understanding of the limitations of this screening test and wishes to proceed with screening PSA testing.   Visit for preventive health examination Depression screen negative. Health Maintenance reviewed -- Declines flu shot, HIV screen and Hep C screen. Agrees to Colonoscopy.. Preventive schedule discussed and handout given in AVS. Will obtain fasting labs  today.     Piedad ClimesWilliam Cody Teela Narducci, PA-C

## 2017-10-18 ENCOUNTER — Encounter: Payer: Self-pay | Admitting: Emergency Medicine

## 2017-11-25 ENCOUNTER — Ambulatory Visit (AMBULATORY_SURGERY_CENTER): Payer: Self-pay | Admitting: *Deleted

## 2017-11-25 ENCOUNTER — Other Ambulatory Visit: Payer: Self-pay

## 2017-11-25 VITALS — Ht 69.0 in | Wt 240.0 lb

## 2017-11-25 DIAGNOSIS — Z1211 Encounter for screening for malignant neoplasm of colon: Secondary | ICD-10-CM

## 2017-11-25 MED ORDER — PEG-KCL-NACL-NASULF-NA ASC-C 140 G PO SOLR: 1.0000 | Freq: Once | ORAL | 0 refills | Status: AC

## 2017-11-25 NOTE — Progress Notes (Signed)
Patient denies any allergies to eggs or soy. Patient denies any past surgeries. Patient denies any oxygen use at home. Patient denies taking any diet/weight loss medications or blood thinners. EMMI education assisgned to patient on colonoscopy, this was explained and instructions given to patient. [facility] card given to pt, instructed him to take this to his pharmacy.

## 2017-12-06 ENCOUNTER — Encounter: Payer: Self-pay | Admitting: Gastroenterology

## 2017-12-09 ENCOUNTER — Encounter: Payer: Self-pay | Admitting: Gastroenterology

## 2018-11-14 ENCOUNTER — Encounter: Payer: PRIVATE HEALTH INSURANCE | Admitting: Physician Assistant

## 2018-11-14 NOTE — Progress Notes (Signed)
I have discussed the procedure for the virtual visit with the patient who has given consent to proceed with assessment and treatment.   Riely Baskett S Wilford Merryfield, CMA     

## 2018-11-15 NOTE — Progress Notes (Signed)
This encounter was created in error - please disregard.

## 2018-11-21 ENCOUNTER — Encounter: Payer: PRIVATE HEALTH INSURANCE | Admitting: Physician Assistant

## 2018-11-21 NOTE — Progress Notes (Deleted)
I have discussed the procedure for the virtual visit with the patient who has given consent to proceed with assessment and treatment.   Karalynn Cottone S Davidjames Blansett, CMA     

## 2018-11-22 NOTE — Progress Notes (Signed)
This encounter was created in error - please disregard.

## 2022-05-13 ENCOUNTER — Ambulatory Visit (INDEPENDENT_AMBULATORY_CARE_PROVIDER_SITE_OTHER): Payer: 59 | Admitting: Family Medicine

## 2022-05-13 ENCOUNTER — Encounter: Payer: Self-pay | Admitting: Family Medicine

## 2022-05-13 VITALS — BP 156/92 | HR 88 | Temp 98.0°F | Ht 68.0 in | Wt 254.6 lb

## 2022-05-13 DIAGNOSIS — Z0000 Encounter for general adult medical examination without abnormal findings: Secondary | ICD-10-CM | POA: Diagnosis not present

## 2022-05-13 DIAGNOSIS — Z125 Encounter for screening for malignant neoplasm of prostate: Secondary | ICD-10-CM | POA: Diagnosis not present

## 2022-05-13 DIAGNOSIS — Z789 Other specified health status: Secondary | ICD-10-CM | POA: Diagnosis not present

## 2022-05-13 DIAGNOSIS — I10 Essential (primary) hypertension: Secondary | ICD-10-CM

## 2022-05-13 DIAGNOSIS — R0681 Apnea, not elsewhere classified: Secondary | ICD-10-CM | POA: Diagnosis not present

## 2022-05-13 LAB — COMPREHENSIVE METABOLIC PANEL
ALT: 61 U/L — ABNORMAL HIGH (ref 0–53)
AST: 69 U/L — ABNORMAL HIGH (ref 0–37)
Albumin: 4.2 g/dL (ref 3.5–5.2)
Alkaline Phosphatase: 67 U/L (ref 39–117)
BUN: 13 mg/dL (ref 6–23)
CO2: 29 mEq/L (ref 19–32)
Calcium: 9.4 mg/dL (ref 8.4–10.5)
Chloride: 100 mEq/L (ref 96–112)
Creatinine, Ser: 0.76 mg/dL (ref 0.40–1.50)
GFR: 98.46 mL/min (ref >60.00)
Glucose, Bld: 83 mg/dL (ref 70–99)
Potassium: 4.2 mEq/L (ref 3.5–5.1)
Sodium: 138 mEq/L (ref 135–145)
Total Bilirubin: 0.5 mg/dL (ref 0.2–1.2)
Total Protein: 7.5 g/dL (ref 6.0–8.3)

## 2022-05-13 LAB — URINALYSIS, ROUTINE W REFLEX MICROSCOPIC
Bilirubin Urine: NEGATIVE
Ketones, ur: NEGATIVE
Leukocytes,Ua: NEGATIVE
Nitrite: NEGATIVE
Specific Gravity, Urine: 1.015 (ref 1.000–1.030)
Total Protein, Urine: NEGATIVE
Urine Glucose: NEGATIVE
Urobilinogen, UA: 2 — AB (ref 0.0–1.0)
pH: 7 (ref 5.0–8.0)

## 2022-05-13 LAB — PSA: PSA: 4.37 ng/mL — ABNORMAL HIGH (ref 0.10–4.00)

## 2022-05-13 LAB — CBC
HCT: 43.2 % (ref 39.0–52.0)
Hemoglobin: 13.9 g/dL (ref 13.0–17.0)
MCHC: 32.2 g/dL (ref 30.0–36.0)
MCV: 95 fl (ref 78.0–100.0)
Platelets: 277 10*3/uL (ref 150.0–400.0)
RBC: 4.55 Mil/uL (ref 4.22–5.81)
RDW: 17.3 % — ABNORMAL HIGH (ref 11.5–15.5)
WBC: 5.7 10*3/uL (ref 4.0–10.5)

## 2022-05-13 LAB — LIPID PANEL
Cholesterol: 177 mg/dL (ref 0–200)
HDL: 65.6 mg/dL (ref >39.00)
LDL Cholesterol: 98 mg/dL (ref 0–99)
NonHDL: 111.12
Total CHOL/HDL Ratio: 3
Triglycerides: 64 mg/dL (ref 0.0–149.0)
VLDL: 12.8 mg/dL (ref 0.0–40.0)

## 2022-05-13 MED ORDER — AMLODIPINE BESYLATE 5 MG PO TABS: 5.0000 mg | ORAL_TABLET | Freq: Every day | ORAL | 0 refills | Status: DC

## 2022-05-13 NOTE — Progress Notes (Signed)
Established Patient Office Visit  Subjective   Patient ID: Joe Mueller, male    DOB: 1961/06/09  Age: 61 y.o. MRN: [id number]  Chief Complaint  Patient presents with   Establish Care    NP/establish care, no concerns. Patient fasting.     HPI for establishment of care and a physical exam.  History of hypertension, sleep apnea.  Apnea has not currently treated.  His CPAP machine was recalled and he lost his insurance before he could get a new one.  He has no regular dental care.  His job is physically demanding.  He loads and unloads trucks.  He gets over 10,000 steps daily.  Blood pressures not currently treated.  He did quit tobacco back in April of this year.  He drinks a pint of vodka daily.  His wife dolls it out for him.  He has never had a colonoscopy.  He denies issues with stooling or urination.    Review of Systems  Constitutional: Negative.   HENT: Negative.    Eyes:  Negative for blurred vision, discharge and redness.  Respiratory: Negative.    Cardiovascular: Negative.   Gastrointestinal:  Negative for abdominal pain, blood in stool, constipation and melena.  Genitourinary: Negative.  Negative for dysuria, frequency and urgency.  Musculoskeletal: Negative.  Negative for myalgias.  Skin:  Negative for rash.  Neurological:  Negative for tingling, loss of consciousness and weakness.  Endo/Heme/Allergies:  Negative for polydipsia.      05/13/2022   10:18 AM 10/14/2017    8:41 AM 11/06/2016   10:44 AM  Depression screen PHQ 2/9  Decreased Interest 0 0 0  Down, Depressed, Hopeless 0 0 0  PHQ - 2 Score 0 0 0  Altered sleeping  0 2  Tired, decreased energy  0 0  Change in appetite  0 0  Feeling bad or failure about yourself   0 0  Trouble concentrating  0 0  Moving slowly or fidgety/restless  0 0  Suicidal thoughts  0 0  PHQ-9 Score  0 2  Difficult doing work/chores  Not difficult at all        Objective:     BP (!) 156/92 (BP Location: Left Arm, Patient  Position: Sitting, Cuff Size: Large)   Pulse 88   Temp 98 F (36.7 C) (Temporal)   Ht 5\' 8"  (1.727 m)   Wt 254 lb 9.6 oz (115.5 kg)   SpO2 95%   BMI 38.71 kg/m  BP Readings from Last 3 Encounters:  05/13/22 (!) 156/92  10/14/17 140/90  11/06/16 138/90   Wt Readings from Last 3 Encounters:  05/13/22 254 lb 9.6 oz (115.5 kg)  11/25/17 240 lb (108.9 kg)  10/14/17 239 lb (108.4 kg)      Physical Exam Constitutional:      General: He is not in acute distress.    Appearance: Normal appearance. He is not ill-appearing, toxic-appearing or diaphoretic.  HENT:     Head: Normocephalic and atraumatic.     Right Ear: External ear normal.     Left Ear: External ear normal.     Mouth/Throat:     Mouth: Mucous membranes are moist.     Pharynx: Oropharynx is clear. No oropharyngeal exudate or posterior oropharyngeal erythema.  Eyes:     General: No scleral icterus.       Right eye: No discharge.        Left eye: No discharge.     Extraocular Movements: Extraocular movements intact.  Conjunctiva/sclera: Conjunctivae normal.     Pupils: Pupils are equal, round, and reactive to light.  Cardiovascular:     Rate and Rhythm: Normal rate and regular rhythm.  Pulmonary:     Effort: Pulmonary effort is normal. No respiratory distress.     Breath sounds: Normal breath sounds.  Abdominal:     General: Bowel sounds are normal.     Tenderness: There is no abdominal tenderness. There is no guarding.  Genitourinary:    Comments: Declines exam  Musculoskeletal:     Cervical back: No rigidity or tenderness.  Skin:    General: Skin is warm and dry.  Neurological:     Mental Status: He is alert and oriented to person, place, and time.  Psychiatric:        Mood and Affect: Mood normal.        Behavior: Behavior normal.      No results found for any visits on 05/13/22.    The ASCVD Risk score (Arnett DK, et al., 2019) failed to calculate for the following reasons:   Cannot find a  previous HDL lab   Cannot find a previous total cholesterol lab    Assessment & Plan:   Problem List Items Addressed This Visit   None Visit Diagnoses     Healthcare maintenance    -  Primary   Relevant Orders   Lipid panel   PSA   Ambulatory referral to Gastroenterology   Essential hypertension       Relevant Medications   amLODipine (NORVASC) 5 MG tablet   Other Relevant Orders   CBC   Comprehensive metabolic panel   Urinalysis, Routine w reflex microscopic   Apnea       Relevant Orders   Ambulatory referral to Pulmonology   Alcohol use           Return in about 8 weeks (around 07/08/2022).  Start amlodipine 5 mg for hypertension.  Congratulated him for stopping smoking.  Advised that he should be consuming no more than 2 2 ounce servings of gins daily.  Advised that alcohol is partially responsible for his high blood pressure.  Refer to GI for first colonoscopy.  Referred pulmonology for possible repeat sleep test and a new CPAP machine.  Explained that alcohol affects his deep sleep.  Untreated apnea affects the quality of his sleep, his blood pressure and puts him at higher risk for stroke.  Advised him to pursue regular dental care.  Declines the flu vaccine.  Information was given on managing hypertension.  Libby Maw, MD

## 2022-06-05 ENCOUNTER — Ambulatory Visit (INDEPENDENT_AMBULATORY_CARE_PROVIDER_SITE_OTHER): Payer: 59 | Admitting: Adult Health

## 2022-06-05 ENCOUNTER — Telehealth: Payer: Self-pay | Admitting: *Deleted

## 2022-06-05 ENCOUNTER — Encounter: Payer: Self-pay | Admitting: Adult Health

## 2022-06-05 VITALS — BP 140/86 | HR 102 | Temp 98.0°F | Ht 68.0 in | Wt 254.0 lb

## 2022-06-05 DIAGNOSIS — G4733 Obstructive sleep apnea (adult) (pediatric): Secondary | ICD-10-CM | POA: Diagnosis not present

## 2022-06-05 DIAGNOSIS — R0683 Snoring: Secondary | ICD-10-CM

## 2022-06-05 DIAGNOSIS — E669 Obesity, unspecified: Secondary | ICD-10-CM

## 2022-06-05 DIAGNOSIS — R0681 Apnea, not elsewhere classified: Secondary | ICD-10-CM | POA: Diagnosis not present

## 2022-06-05 NOTE — Assessment & Plan Note (Signed)
Healthy weight loss 

## 2022-06-05 NOTE — Telephone Encounter (Signed)
Called Brad with [facility] and verified that the patient was not active with [facility], [facility] or [facility].  Called and spoke with [facility] and verified he was not active with them.  Called [facility] and verifed he was active with them in the past.  He got a CPAP machine in 2014, she will fax the sleep study as well as the CPAP titration study to [phone].  Will await fax.

## 2022-06-05 NOTE — Patient Instructions (Signed)
Set up for home sleep study  Work on healthy weight loss  Do not drive if sleepy  Follow up with 3  months to discuss sleep study results and treatment plan

## 2022-06-05 NOTE — Progress Notes (Signed)
Has had Maderna x2.

## 2022-06-05 NOTE — Progress Notes (Signed)
Reviewed and agree with assessment/plan.   Coralyn Helling, MD [facility] Pulmonary/Critical Care 06/05/2022, 12:39 PM Pager:  [phone]

## 2022-06-05 NOTE — Assessment & Plan Note (Signed)
History of moderate obstructive sleep apnea, snoring, restless sleep and witnessed apneic events all suspicious for ongoing sleep apnea.  - discussed how weight can impact sleep and risk for sleep disordered breathing - discussed options to assist with weight loss: combination of diet modification, cardiovascular and strength training exercises   - had an extensive discussion regarding the adverse health consequences related to untreated sleep disordered breathing - specifically discussed the risks for hypertension, coronary artery disease, cardiac dysrhythmias, cerebrovascular disease, and diabetes - lifestyle modification discussed   - discussed how sleep disruption can increase risk of accidents, particularly when driving - safe driving practices were discussed Set patient up for home sleep study  Plan  Patient Instructions  Set up for home sleep study  Work on healthy weight loss  Do not drive if sleepy  Follow up with 3  months to discuss sleep study results and treatment plan

## 2022-06-05 NOTE — Progress Notes (Signed)
@Patient  ID: , male    DOB: Apr 30, 1961, 61 y.o.   MRN: [id number]  Chief Complaint  Patient presents with   Consult    Referring provider: [id number]  HPI: 61 year old male seen for sleep consult June 05, 2022 to establish for sleep apnea Originally diagnosed with moderate sleep apnea in 2012  TEST/EVENTS :  [facility] December 25, 2010 AHI 19.8, SPO2 low at 82%  06/05/2022 Sleep consult  Patient presents for a sleep consult today.  Patient wants to establish for sleep apnea.  Says he was diagnosed with sleep apnea in 2012.  Was started on CPAP which he wore for couple years but then stopped.  Says it was not very comfortable and did not really like to wear.  Also found out later that his CPAP machine has been recalled.  Patient has been off her CPAP for greater than 5 years.  Patient says he continues to have ongoing snoring and restless sleep . Snoring is keeping his spouse up .  Patient works third shift 2 AM to 8 AM.  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  Patient's weight is up 20 pounds over the last 2 years.  Current weight is at 254 pounds with a BMI at 38.  He does not use any sleep aids.  Has no removable dental work.  Does not drink any caffeine.  No history of stroke or congestive heart.  No symptoms suspicious for cataplexy or sleep paralysis  Social history patient is married.  Has adult children.  Works as a [employer].  Works third shift.  Quit smoking March 2023.  Minimal smoking history 3-pack-year history.  3 mixed drinks daily.  No drug use.  Family history none  Past Surgical History:  Procedure Laterality Date   NO PAST SURGERIES       No Known Allergies  Immunization History  Administered Date(s) Administered   Tdap 11/06/2016    Past Medical History:  Diagnosis Date   Elevated BP without diagnosis of hypertension    Sleep  apnea    CPAP    Tobacco History: Social History   Tobacco Use  Smoking Status Former   Packs/day: 0.25   Types: Cigarettes   Quit date: 11/01/2021   Years since quitting: 0.5  Smokeless Tobacco Never   Counseling given: Not Answered   Outpatient Medications Prior to Visit  Medication Sig Dispense Refill   amLODipine (NORVASC) 5 MG tablet Take 1 tablet (5 mg total) by mouth daily. 90 tablet 0   No facility-administered medications prior to visit.     Review of Systems:   Constitutional:   No  weight loss, night sweats,  Fevers, chills, fatigue, or  lassitude.  HEENT:   No headaches,  Difficulty swallowing,  Tooth/dental problems, or  Sore throat,                No sneezing, itching, ear ache, nasal congestion, post nasal drip,   CV:  No chest pain,  Orthopnea, PND, swelling in lower extremities, anasarca, dizziness, palpitations, syncope.   GI  No heartburn, indigestion, abdominal pain, nausea, vomiting, diarrhea, change in bowel habits, loss of appetite, bloody stools.   Resp: No shortness of breath with exertion or at rest.  No excess mucus, no productive cough,  No non-productive cough,  No coughing up of blood.  No change  in color of mucus.  No wheezing.  No chest wall deformity  Skin: no rash or lesions.  GU: no dysuria, change in color of urine, no urgency or frequency.  No flank pain, no hematuria   MS:  No joint pain or swelling.  No decreased range of motion.  No back pain.    Physical Exam  BP (!) 140/86 (BP Location: Left Arm, Patient Position: Sitting, Cuff Size: Large)   Pulse (!) 102   Temp 98 F (36.7 C) (Oral)   Ht 5\' 8"  (1.727 m)   Wt 254 lb (115.2 kg)   SpO2 94%   BMI 38.62 kg/m   GEN: A/Ox3; pleasant , NAD, well nourished    HEENT:  Marty/AT,  NOSE-clear, THROAT-clear, no lesions, no postnasal drip or exudate noted. Class 3 MP airway   NECK:  Supple w/ fair ROM; no JVD; normal carotid impulses w/o bruits; no thyromegaly or nodules  palpated; no lymphadenopathy.    RESP  Clear  P & A; w/o, wheezes/ rales/ or rhonchi. no accessory muscle use, no dullness to percussion  CARD:  RRR, no m/r/g, no peripheral edema, pulses intact, no cyanosis or clubbing.  GI:   Soft & nt; nml bowel sounds; no organomegaly or masses detected.   Musco: Warm bil, no deformities or joint swelling noted.   Neuro: alert, no focal deficits noted.    Skin: Warm, no lesions or rashes    Lab Results:  CBC    Component Value Date/Time   WBC 5.7 05/13/2022 1054   RBC 4.55 05/13/2022 1054   HGB 13.9 05/13/2022 1054   HCT 43.2 05/13/2022 1054   PLT 277.0 05/13/2022 1054   MCV 95.0 05/13/2022 1054   MCH 32.4 03/06/2015 0405   MCHC 32.2 05/13/2022 1054   RDW 17.3 (H) 05/13/2022 1054   LYMPHSABS 2.7 10/14/2017 0919   MONOABS 0.6 10/14/2017 0919   EOSABS 0.1 10/14/2017 0919   BASOSABS 0.0 10/14/2017 0919    BMET    Component Value Date/Time   NA 138 05/13/2022 1054   K 4.2 05/13/2022 1054   CL 100 05/13/2022 1054   CO2 29 05/13/2022 1054   GLUCOSE 83 05/13/2022 1054   BUN 13 05/13/2022 1054   CREATININE 0.76 05/13/2022 1054   CALCIUM 9.4 05/13/2022 1054   GFRNONAA >60 03/06/2015 0405   GFRAA >60 03/06/2015 0405    BNP No results found for: "BNP"  ProBNP No results found for: "PROBNP"  Imaging: No results found.        No data to display          No results found for: "NITRICOXIDE"      Assessment & Plan:   No problem-specific Assessment & Plan notes found for this encounter.     03/08/2015, NP 06/05/2022

## 2022-07-07 ENCOUNTER — Telehealth: Payer: Self-pay | Admitting: Family Medicine

## 2022-07-07 NOTE — Telephone Encounter (Signed)
error 

## 2022-07-08 ENCOUNTER — Ambulatory Visit: Payer: PRIVATE HEALTH INSURANCE | Admitting: Family Medicine

## 2022-08-10 ENCOUNTER — Other Ambulatory Visit: Payer: Self-pay | Admitting: Family Medicine

## 2022-08-10 DIAGNOSIS — I10 Essential (primary) hypertension: Secondary | ICD-10-CM

## 2022-08-17 ENCOUNTER — Encounter: Payer: Self-pay | Admitting: Family Medicine

## 2022-08-17 ENCOUNTER — Ambulatory Visit (INDEPENDENT_AMBULATORY_CARE_PROVIDER_SITE_OTHER): Payer: Managed Care, Other (non HMO) | Admitting: Family Medicine

## 2022-08-17 VITALS — BP 160/88 | HR 110 | Temp 97.1°F | Ht 68.0 in | Wt 253.0 lb

## 2022-08-17 DIAGNOSIS — Z1211 Encounter for screening for malignant neoplasm of colon: Secondary | ICD-10-CM | POA: Diagnosis not present

## 2022-08-17 DIAGNOSIS — I10 Essential (primary) hypertension: Secondary | ICD-10-CM

## 2022-08-17 DIAGNOSIS — R945 Abnormal results of liver function studies: Secondary | ICD-10-CM | POA: Diagnosis not present

## 2022-08-17 DIAGNOSIS — Z114 Encounter for screening for human immunodeficiency virus [HIV]: Secondary | ICD-10-CM | POA: Diagnosis not present

## 2022-08-17 DIAGNOSIS — R7989 Other specified abnormal findings of blood chemistry: Secondary | ICD-10-CM

## 2022-08-17 DIAGNOSIS — R972 Elevated prostate specific antigen [PSA]: Secondary | ICD-10-CM

## 2022-08-17 DIAGNOSIS — Z1159 Encounter for screening for other viral diseases: Secondary | ICD-10-CM | POA: Diagnosis not present

## 2022-08-17 MED ORDER — AMLODIPINE BESYLATE 10 MG PO TABS: 10.0000 mg | ORAL_TABLET | Freq: Every day | ORAL | 0 refills | Status: DC

## 2022-08-17 MED ORDER — AMLODIPINE BESYLATE 5 MG PO TABS: 5.0000 mg | ORAL_TABLET | Freq: Every day | ORAL | 0 refills | Status: DC

## 2022-08-17 NOTE — Progress Notes (Signed)
Subjective:  Patient ID: Joe Mueller, male    DOB: 03/18/1961  Age: 62 y.o. MRN: [id number]  Chief Complaint  Patient presents with   Hypertension   HPI:   He was last seen for hypertension 3 months ago.  BP at that visit was 156/92. Management includes norvasc 5 mg. Ran out of medication yesterday.  He reports excellent compliance with treatment. He is not having side effects.  He is following a Regular diet. He is exercising- walks daily He does not smoke. Denies issues with urine flow. Use of agents associated with hypertension: none.    Pertinent labs: Lab Results  Component Value Date   CHOL 177 05/13/2022   HDL 65.60 05/13/2022   LDLCALC 98 05/13/2022   TRIG 64.0 05/13/2022   CHOLHDL 3 05/13/2022   Lab Results  Component Value Date   NA 138 05/13/2022   K 4.2 05/13/2022   CREATININE 0.76 05/13/2022   GFRNONAA >60 03/06/2015   GLUCOSE 83 05/13/2022     The 10-year ASCVD risk score (Arnett DK, et al., 2019) is: 17.1%       08/17/2022    1:57 PM 05/13/2022   10:18 AM 10/14/2017    8:41 AM 11/06/2016   10:44 AM 11/06/2016   10:35 AM  Depression screen PHQ 2/9  Decreased Interest 0 0 0 0 0  Down, Depressed, Hopeless 0 0 0 0 0  PHQ - 2 Score 0 0 0 0 0  Altered sleeping   0 2   Tired, decreased energy   0 0   Change in appetite   0 0   Feeling bad or failure about yourself    0 0   Trouble concentrating   0 0   Moving slowly or fidgety/restless   0 0   Suicidal thoughts   0 0   PHQ-9 Score   0 2   Difficult doing work/chores   Not difficult at all           11/06/2016   10:35 AM 05/13/2022   10:18 AM 08/17/2022    1:57 PM  Fall Risk  Falls in the past year? No 0 0  Was there an injury with Fall?   0  Fall Risk Category Calculator   0  (RETIRED) Patient Fall Risk Level  Low fall risk   Patient at Risk for Falls Due to   No Fall Risks  Fall risk Follow up   Falls evaluation completed      Review of Systems  Constitutional:  Negative for  chills, diaphoresis, fatigue and fever.  HENT:  Negative for congestion, ear pain and sore throat.   Respiratory:  Negative for cough and shortness of breath.   Cardiovascular:  Negative for chest pain and leg swelling.  Gastrointestinal:  Negative for abdominal pain, constipation, diarrhea, nausea and vomiting.  Genitourinary:  Negative for decreased urine volume, dysuria, frequency and urgency.  Musculoskeletal:  Negative for arthralgias and myalgias.  Neurological:  Negative for dizziness and headaches.  Psychiatric/Behavioral:  Negative for dysphoric mood.     No current outpatient medications on file prior to visit.   No current facility-administered medications on file prior to visit.   Past Medical History:  Diagnosis Date   Elevated BP without diagnosis of hypertension    Sleep apnea    CPAP   Past Surgical History:  Procedure Laterality Date   NO PAST SURGERIES      Family History  Problem Relation Age of Onset  Hypertension Mother    Hyperlipidemia Mother    Hyperlipidemia Father    Hypertension Father    Heart attack Father 40   Healthy Sister        x 5   Benign prostatic hyperplasia Brother    Designer, jewellery cancer Neg Hx    Esophageal cancer Neg Hx    Stomach cancer Neg Hx    Social History   Socioeconomic History   Marital status: Married    Spouse name: Elnita Maxwell   Number of children: Not on file   Years of education: Not on file   Highest education level: Not on file  Occupational History   Occupation: Delivery Driver  Tobacco Use   Smoking status: Former    Packs/day: 0.25    Types: Cigarettes    Quit date: 11/01/2021    Years since quitting: 0.7   Smokeless tobacco: Never  Vaping Use   Vaping Use: Never used  Substance and Sexual Activity   Alcohol use: Yes    Alcohol/week: 10.0 - 15.0 standard drinks of alcohol    Types: 10 - 15 Standard drinks or equivalent per week    Comment: 1 pint of gin per day x 20 years   Drug use: No    Sexual activity: Yes    Partners: Female    Birth control/protection: None  Other Topics Concern   Not on file  Social History Narrative   Not on file   Social Determinants of Health   Financial Resource Strain: Low Risk  (08/17/2022)   Overall Financial Resource Strain (CARDIA)    Difficulty of Paying Living Expenses: Not hard at all  Food Insecurity: No Food Insecurity (08/17/2022)   Hunger Vital Sign    Worried About Running Out of Food in the Last Year: Never true    Ran Out of Food in the Last Year: Never true  Transportation Needs: No Transportation Needs (08/17/2022)   PRAPARE - Administrator, Civil Service (Medical): No    Lack of Transportation (Non-Medical): No  Physical Activity: Sufficiently Active (08/17/2022)   Exercise Vital Sign    Days of Exercise per Week: 6 days    Minutes of Exercise per Session: 120 min  Stress: No Stress Concern Present (08/17/2022)   [employer] of Occupational Health - Occupational Stress Questionnaire    Feeling of Stress : Not at all  Social Connections: Moderately Isolated (08/17/2022)   Social Connection and Isolation Panel [NHANES]    Frequency of Communication with Friends and Family: More than three times a week    Frequency of Social Gatherings with Friends and Family: More than three times a week    Attends Religious Services: Never    Database administrator or Organizations: No    Attends Engineer, structural: Never    Marital Status: Married    Objective:  BP (!) 160/88   Pulse (!) 110   Temp (!) 97.1 F (36.2 C)   Ht 5\' 8"  (1.727 m)   Wt 253 lb (114.8 kg)   SpO2 100%   BMI 38.47 kg/m      08/17/2022    1:51 PM 06/05/2022    9:44 AM 05/13/2022   10:26 AM  BP/Weight  Systolic BP 160 140 156  Diastolic BP 88 86 92  Wt. (Lbs) 253 254   BMI 38.47 kg/m2 38.62 kg/m2     Physical Exam Constitutional:      General: He is  not in acute distress.    Appearance: Normal appearance. He is not  ill-appearing, toxic-appearing or diaphoretic.  HENT:     Head: Normocephalic and atraumatic.     Right Ear: External ear normal.     Left Ear: External ear normal.  Eyes:     General: No scleral icterus.       Right eye: No discharge.        Left eye: No discharge.     Extraocular Movements: Extraocular movements intact.     Conjunctiva/sclera: Conjunctivae normal.  Cardiovascular:     Rate and Rhythm: Normal rate and regular rhythm.  Pulmonary:     Effort: Pulmonary effort is normal. No respiratory distress.     Breath sounds: Normal breath sounds.  Musculoskeletal:     Cervical back: No rigidity or tenderness.  Skin:    General: Skin is warm and dry.  Neurological:     Mental Status: He is alert and oriented to person, place, and time.  Psychiatric:        Mood and Affect: Mood normal.        Behavior: Behavior normal.   Thanks  Diabetic Foot Exam - Simple   No data filed      Lab Results  Component Value Date   WBC 5.7 05/13/2022   HGB 13.9 05/13/2022   HCT 43.2 05/13/2022   PLT 277.0 05/13/2022   GLUCOSE 83 05/13/2022   CHOL 177 05/13/2022   TRIG 64.0 05/13/2022   HDL 65.60 05/13/2022   LDLCALC 98 05/13/2022   ALT 61 (H) 05/13/2022   AST 69 (H) 05/13/2022   NA 138 05/13/2022   K 4.2 05/13/2022   CL 100 05/13/2022   CREATININE 0.76 05/13/2022   BUN 13 05/13/2022   CO2 29 05/13/2022   PSA 4.37 (H) 05/13/2022   HGBA1C 6.2 10/14/2017      Assessment & Plan:    Essential hypertension -     Basic metabolic panel -     amLODIPine Besylate; Take 1 tablet (10 mg total) by mouth daily.  Dispense: 90 tablet; Refill: 0  Screening for colon cancer -     Ambulatory referral to Gastroenterology  Elevated PSA -     PSA  Elevated LFTs -     Hepatic function panel -     Hepatitis C antibody -     HIV Antibody (routine testing w rflx) -     Hepatitis B surface antigen  Screening for HIV (human immunodeficiency virus) -     HIV Antibody (routine testing w  rflx)  Encounter for hepatitis C screening test for low risk patient -     Hepatitis C antibody     Meds ordered this encounter  Medications   DISCONTD: amLODipine (NORVASC) 5 MG tablet    Sig: Take 1 tablet (5 mg total) by mouth daily.    Dispense:  90 tablet    Refill:  0   amLODipine (NORVASC) 10 MG tablet    Sig: Take 1 tablet (10 mg total) by mouth daily.    Dispense:  90 tablet    Refill:  0    Orders Placed This Encounter  Procedures   Basic metabolic panel   PSA   Hepatic function panel   Hepatitis C antibody   HIV Antibody (routine testing w rflx)   Hepatitis B Surface AntiGEN   Ambulatory referral to Gastroenterology     Follow-up: Return in about 8 weeks (around 10/12/2022).  An After  Visit Summary was printed and given to the patient.  Have increased amlodipine to 10 mg daily.  Patient to follow-up in 8 weeks.  Advised patient to consume no more than 2 servings of alcohol in a given setting.  Rechecking LFTs teas and workup for viral hepatitis.  Rechecking elevated PSA.  Libby Maw, MD [facility] [phone]

## 2022-08-17 NOTE — Progress Notes (Signed)
Subjective:  Patient ID: Joe Mueller, male    DOB: [birth year]  Age: 62 y.o. MRN: [id number]  Chief Complaint  Patient presents with   Hypertension   HPI:   He was last seen for hypertension 3 months ago.  BP at that visit was 156/92. Management includes norvasc 5 mg. Ran out of medication yesterday.  He reports excellent compliance with treatment. He is not having side effects.  He is following a Regular diet. He is exercising- walks daily He does not smoke. Denies issues with urine flow. Use of agents associated with hypertension: none.    Pertinent labs: Lab Results  Component Value Date   CHOL 177 05/13/2022   HDL 65.60 05/13/2022   LDLCALC 98 05/13/2022   TRIG 64.0 05/13/2022   CHOLHDL 3 05/13/2022   Lab Results  Component Value Date   NA 138 05/13/2022   K 4.2 05/13/2022   CREATININE 0.76 05/13/2022   GFRNONAA >60 03/06/2015   GLUCOSE 83 05/13/2022     The 10-year ASCVD risk score (Arnett DK, et al., 2019) is: 17.1%       08/17/2022    1:57 PM 05/13/2022   10:18 AM 10/14/2017    8:41 AM 11/06/2016   10:44 AM 11/06/2016   10:35 AM  Depression screen PHQ 2/9  Decreased Interest 0 0 0 0 0  Down, Depressed, Hopeless 0 0 0 0 0  PHQ - 2 Score 0 0 0 0 0  Altered sleeping   0 2   Tired, decreased energy   0 0   Change in appetite   0 0   Feeling bad or failure about yourself    0 0   Trouble concentrating   0 0   Moving slowly or fidgety/restless   0 0   Suicidal thoughts   0 0   PHQ-9 Score   0 2   Difficult doing work/chores   Not difficult at all           11/06/2016   10:35 AM 05/13/2022   10:18 AM 08/17/2022    1:57 PM  Fall Risk  Falls in the past year? No 0 0  Was there an injury with Fall?   0  Fall Risk Category Calculator   0  (RETIRED) Patient Fall Risk Level  Low fall risk   Patient at Risk for Falls Due to   No Fall Risks  Fall risk Follow up   Falls evaluation completed      Review of Systems  Constitutional:  Negative for  chills, diaphoresis, fatigue and fever.  HENT:  Negative for congestion, ear pain and sore throat.   Respiratory:  Negative for cough and shortness of breath.   Cardiovascular:  Negative for chest pain and leg swelling.  Gastrointestinal:  Negative for abdominal pain, constipation, diarrhea, nausea and vomiting.  Genitourinary:  Negative for decreased urine volume, dysuria, frequency and urgency.  Musculoskeletal:  Negative for arthralgias and myalgias.  Neurological:  Negative for dizziness and headaches.  Psychiatric/Behavioral:  Negative for dysphoric mood.     No current outpatient medications on file prior to visit.   No current facility-administered medications on file prior to visit.   Past Medical History:  Diagnosis Date   Elevated BP without diagnosis of hypertension    Sleep apnea    CPAP   Past Surgical History:  Procedure Laterality Date   NO PAST SURGERIES      Family History  Problem Relation Age of Onset  Hypertension Mother    Hyperlipidemia Mother    Hyperlipidemia Father    Hypertension Father    Heart attack Father 40   Healthy Sister        x 5   Benign prostatic hyperplasia Brother    Designer, jewellery cancer Neg Hx    Esophageal cancer Neg Hx    Stomach cancer Neg Hx    Social History   Socioeconomic History   Marital status: Married    Spouse name: Elnita Maxwell   Number of children: Not on file   Years of education: Not on file   Highest education level: Not on file  Occupational History   Occupation: Delivery Driver  Tobacco Use   Smoking status: Former    Packs/day: 0.25    Types: Cigarettes    Quit date: 11/01/2021    Years since quitting: 0.7   Smokeless tobacco: Never  Vaping Use   Vaping Use: Never used  Substance and Sexual Activity   Alcohol use: Yes    Alcohol/week: 10.0 - 15.0 standard drinks of alcohol    Types: 10 - 15 Standard drinks or equivalent per week    Comment: 1 pint of gin per day x 20 years   Drug use: No    Sexual activity: Yes    Partners: Female    Birth control/protection: None  Other Topics Concern   Not on file  Social History Narrative   Not on file   Social Determinants of Health   Financial Resource Strain: Low Risk  (08/17/2022)   Overall Financial Resource Strain (CARDIA)    Difficulty of Paying Living Expenses: Not hard at all  Food Insecurity: No Food Insecurity (08/17/2022)   Hunger Vital Sign    Worried About Running Out of Food in the Last Year: Never true    Ran Out of Food in the Last Year: Never true  Transportation Needs: No Transportation Needs (08/17/2022)   PRAPARE - Administrator, Civil Service (Medical): No    Lack of Transportation (Non-Medical): No  Physical Activity: Sufficiently Active (08/17/2022)   Exercise Vital Sign    Days of Exercise per Week: 6 days    Minutes of Exercise per Session: 120 min  Stress: No Stress Concern Present (08/17/2022)   [employer] of Occupational Health - Occupational Stress Questionnaire    Feeling of Stress : Not at all  Social Connections: Moderately Isolated (08/17/2022)   Social Connection and Isolation Panel [NHANES]    Frequency of Communication with Friends and Family: More than three times a week    Frequency of Social Gatherings with Friends and Family: More than three times a week    Attends Religious Services: Never    Database administrator or Organizations: No    Attends Engineer, structural: Never    Marital Status: Married    Objective:  BP (!) 160/88   Pulse (!) 110   Temp (!) 97.1 F (36.2 C)   Ht 5\' 8"  (1.727 m)   Wt 253 lb (114.8 kg)   SpO2 100%   BMI 38.47 kg/m      08/17/2022    1:51 PM 06/05/2022    9:44 AM 05/13/2022   10:26 AM  BP/Weight  Systolic BP 160 140 156  Diastolic BP 88 86 92  Wt. (Lbs) 253 254   BMI 38.47 kg/m2 38.62 kg/m2     Physical Exam Constitutional:      General: He is  not in acute distress.    Appearance: Normal appearance. He is not  ill-appearing, toxic-appearing or diaphoretic.  HENT:     Head: Normocephalic and atraumatic.     Right Ear: External ear normal.     Left Ear: External ear normal.  Eyes:     General: No scleral icterus.       Right eye: No discharge.        Left eye: No discharge.     Extraocular Movements: Extraocular movements intact.     Conjunctiva/sclera: Conjunctivae normal.  Cardiovascular:     Rate and Rhythm: Normal rate and regular rhythm.  Pulmonary:     Effort: Pulmonary effort is normal. No respiratory distress.     Breath sounds: Normal breath sounds.  Musculoskeletal:     Cervical back: No rigidity or tenderness.  Skin:    General: Skin is warm and dry.  Neurological:     Mental Status: He is alert and oriented to person, place, and time.  Psychiatric:        Mood and Affect: Mood normal.        Behavior: Behavior normal.   Thanks  Diabetic Foot Exam - Simple   No data filed      Lab Results  Component Value Date   WBC 5.7 05/13/2022   HGB 13.9 05/13/2022   HCT 43.2 05/13/2022   PLT 277.0 05/13/2022   GLUCOSE 83 05/13/2022   CHOL 177 05/13/2022   TRIG 64.0 05/13/2022   HDL 65.60 05/13/2022   LDLCALC 98 05/13/2022   ALT 61 (H) 05/13/2022   AST 69 (H) 05/13/2022   NA 138 05/13/2022   K 4.2 05/13/2022   CL 100 05/13/2022   CREATININE 0.76 05/13/2022   BUN 13 05/13/2022   CO2 29 05/13/2022   PSA 4.37 (H) 05/13/2022   HGBA1C 6.2 10/14/2017      Assessment & Plan:    Essential hypertension -     Basic metabolic panel -     amLODIPine Besylate; Take 1 tablet (10 mg total) by mouth daily.  Dispense: 90 tablet; Refill: 0  Screening for colon cancer -     Ambulatory referral to Gastroenterology  Elevated PSA -     PSA  Elevated LFTs -     Hepatic function panel -     Hepatitis C antibody -     HIV Antibody (routine testing w rflx) -     Hepatitis B surface antigen  Screening for HIV (human immunodeficiency virus) -     HIV Antibody (routine testing w  rflx)  Encounter for hepatitis C screening test for low risk patient -     Hepatitis C antibody     Meds ordered this encounter  Medications   DISCONTD: amLODipine (NORVASC) 5 MG tablet    Sig: Take 1 tablet (5 mg total) by mouth daily.    Dispense:  90 tablet    Refill:  0   amLODipine (NORVASC) 10 MG tablet    Sig: Take 1 tablet (10 mg total) by mouth daily.    Dispense:  90 tablet    Refill:  0    Orders Placed This Encounter  Procedures   Basic metabolic panel   PSA   Hepatic function panel   Hepatitis C antibody   HIV Antibody (routine testing w rflx)   Hepatitis B Surface AntiGEN   Ambulatory referral to Gastroenterology     Follow-up: Return in about 8 weeks (around 10/12/2022).  An After  Visit Summary was printed and given to the patient.  Have increased amlodipine to 10 mg daily.  Patient to follow-up in 8 weeks.  Advised patient to consume no more than 2 servings of alcohol in a given setting.  Rechecking LFTs teas and workup for viral hepatitis.  Rechecking elevated PSA.  Libby Maw, MD [facility] [phone]

## 2022-08-18 LAB — HEPATIC FUNCTION PANEL
ALT: 37 U/L (ref 0–53)
AST: 38 U/L — ABNORMAL HIGH (ref 0–37)
Albumin: 4.3 g/dL (ref 3.5–5.2)
Alkaline Phosphatase: 70 U/L (ref 39–117)
Bilirubin, Direct: 0.1 mg/dL (ref 0.0–0.3)
Total Bilirubin: 0.5 mg/dL (ref 0.2–1.2)
Total Protein: 7.7 g/dL (ref 6.0–8.3)

## 2022-08-18 LAB — PSA: PSA: 6.04 ng/mL — ABNORMAL HIGH (ref 0.10–4.00)

## 2022-08-18 LAB — BASIC METABOLIC PANEL
BUN: 14 mg/dL (ref 6–23)
CO2: 25 mEq/L (ref 19–32)
Calcium: 9.5 mg/dL (ref 8.4–10.5)
Chloride: 102 mEq/L (ref 96–112)
Creatinine, Ser: 0.76 mg/dL (ref 0.40–1.50)
GFR: 98.28 mL/min (ref >60.00)
Glucose, Bld: 98 mg/dL (ref 70–99)
Potassium: 3.8 mEq/L (ref 3.5–5.1)
Sodium: 136 mEq/L (ref 135–145)

## 2022-08-18 LAB — HEPATITIS B SURFACE ANTIGEN: Hepatitis B Surface Ag: NONREACTIVE

## 2022-08-18 LAB — HIV ANTIBODY (ROUTINE TESTING W REFLEX): HIV 1&2 Ab, 4th Generation: NONREACTIVE

## 2022-08-18 LAB — HEPATITIS C ANTIBODY: Hepatitis C Ab: NONREACTIVE

## 2022-08-19 NOTE — Addendum Note (Signed)
Addended by: Abelino Derrick A on: 08/19/2022 12:08 PM   Modules accepted: Orders

## 2022-08-25 ENCOUNTER — Encounter: Payer: Managed Care, Other (non HMO) | Admitting: Urology

## 2022-09-07 ENCOUNTER — Ambulatory Visit: Payer: PRIVATE HEALTH INSURANCE | Admitting: Adult Health

## 2022-09-08 ENCOUNTER — Encounter: Payer: Managed Care, Other (non HMO) | Admitting: Urology

## 2022-09-15 ENCOUNTER — Encounter: Payer: Self-pay | Admitting: Urology

## 2022-09-15 ENCOUNTER — Ambulatory Visit (INDEPENDENT_AMBULATORY_CARE_PROVIDER_SITE_OTHER): Payer: Managed Care, Other (non HMO) | Admitting: Urology

## 2022-09-15 ENCOUNTER — Other Ambulatory Visit: Payer: Self-pay

## 2022-09-15 VITALS — BP 143/91 | HR 96 | Ht 68.0 in | Wt 251.0 lb

## 2022-09-15 DIAGNOSIS — A630 Anogenital (venereal) warts: Secondary | ICD-10-CM

## 2022-09-15 DIAGNOSIS — R972 Elevated prostate specific antigen [PSA]: Secondary | ICD-10-CM

## 2022-09-15 DIAGNOSIS — R319 Hematuria, unspecified: Secondary | ICD-10-CM

## 2022-09-15 LAB — URINALYSIS, MICROSCOPIC ONLY
Bacteria, UA: NONE SEEN
Casts: NONE SEEN /lpf
Epithelial Cells (non renal): NONE SEEN /hpf (ref 0–10)
WBC, UA: NONE SEEN /hpf (ref 0–5)

## 2022-09-15 LAB — URINALYSIS
Bilirubin, UA: NEGATIVE
Glucose, UA: NEGATIVE mg/dL
Ketones, POC UA: NEGATIVE mg/dL
Leukocytes, UA: NEGATIVE
Nitrite, UA: NEGATIVE
Protein Ur, POC: 30 mg/dL — AB
Spec Grav, UA: 1.02 (ref 1.010–1.025)
Urobilinogen, UA: 4 E.U./dL — AB
pH, UA: 7 (ref 5.0–8.0)

## 2022-09-15 NOTE — Progress Notes (Addendum)
Assessment: 1. Elevated PSA   2. Genital warts   3. Hematuria, unspecified type      Plan: Will send urine for micro today given dip ++  Today I had a long and detailed discussion with the patient regarding his elevated PSA along with the issues and controversies regarding prostate cancer early detection.  We discussed options for further evaluation including imaging as well as biopsy.  Following our discussion the patient elects to proceed with transrectal ultrasound prostate biopsy.  Petra Kuba of procedure discussed in detail including potential adverse events and complications emphasizing risk for infection. Schedule TRUS/BX next available  Also discussed with him his genital warts.  Will consider cryotherapy following evaluation of his elevated PSA.   ADDENDUM:  Urine micro today neg but on review, micro urine 04/2022 shows significant microhematuria (3-6 rbc/hpf).  Will need heme evaluation as well   Chief Complaint: elevated psa  History of Present Illness:  Joe Mueller is a 62 y.o. male who is seen in consultation from Libby Maw, MD for evaluation of elevated psa. Patient also reports that he has noticed for approximately a year some warty growths involving his genitalia.  Patient denies LUTS.  IPSS = 7 Patient has a half-brother on his mother's side who was treated for localized prostate cancer within the last few years.  No other family history. Patient denies ED    PSA DATA: 10/2016  1.01 09/2017  1.16 04/2022 4.37 07/2022  6.04  Past Medical History:  Past Medical History:  Diagnosis Date   Elevated BP without diagnosis of hypertension    Sleep apnea    CPAP    Past Surgical History:  Past Surgical History:  Procedure Laterality Date   NO PAST SURGERIES      Allergies:  No Known Allergies  Family History:  Family History  Problem Relation Age of Onset   Hypertension Mother    Hyperlipidemia Mother    Hyperlipidemia Father     Hypertension Father    Heart attack Father 45   Healthy Sister        x 5   Benign prostatic hyperplasia Brother    Healthy Brother    Colon cancer Neg Hx    Esophageal cancer Neg Hx    Stomach cancer Neg Hx     Social History:  Social History   Tobacco Use   Smoking status: Former    Packs/day: 0.25    Types: Cigarettes    Quit date: 11/01/2021    Years since quitting: 0.8   Smokeless tobacco: Never  Vaping Use   Vaping Use: Never used  Substance Use Topics   Alcohol use: Yes    Alcohol/week: 10.0 - 15.0 standard drinks of alcohol    Types: 10 - 15 Standard drinks or equivalent per week    Comment: 1 pint of gin per day x 20 years   Drug use: No    Review of symptoms:  Constitutional:  Negative for unexplained weight loss, night sweats, fever, chills ENT:  Negative for nose bleeds, sinus pain, painful swallowing CV:  Negative for chest pain, shortness of breath, exercise intolerance, palpitations, loss of consciousness Resp:  Negative for cough, wheezing, shortness of breath GI:  Negative for nausea, vomiting, diarrhea, bloody stools GU:  Positives noted in HPI; otherwise negative for gross hematuria, dysuria, urinary incontinence Neuro:  Negative for seizures, poor balance, limb weakness, slurred speech Psych:  Negative for lack of energy, depression, anxiety Endocrine:  Negative for polydipsia,  polyuria, symptoms of hypoglycemia (dizziness, hunger, sweating) Hematologic:  Negative for anemia, purpura, petechia, prolonged or excessive bleeding, use of anticoagulants  Allergic:  Negative for difficulty breathing or choking as a result of exposure to anything; no shellfish allergy; no allergic response (rash/itch) to materials, foods  Physical exam: BP (!) 143/91   Pulse 96   Ht 5\' 8"  (1.727 m)   Wt 251 lb (113.9 kg)   BMI 38.16 kg/m  GENERAL APPEARANCE:  Well appearing, well developed, well nourished, NAD  GU: Circumcised phallus with small condyloma mid left  penile shaft.  There is also an approximately 2 cm condyloma along the inguinal crease on the left.  DRE: Normal sphincter tone; prostate is difficult to feel due to body habitus.  I can only feel his apex which is palpably normal.

## 2022-09-15 NOTE — Patient Instructions (Signed)
Prostate Biopsy Instructions  Stop all aspirin or blood thinners (aspirin, plavix, coumadin, warfarin, motrin, ibuprofen, advil, aleve, naproxen, naprosyn) for 7 days prior to the procedure.  If you have any questions about stopping these medications, please contact your primary care physician or cardiologist.  Having a light meal prior to the procedure is recommended.  If you are diabetic or have low blood sugar please bring a small snack or glucose tablet.  A Fleets enema is needed and can be purchased over the counter at a local pharmacy. This will need to be administered 2 hours prior to your procedure. Antibiotics will be administered in the clinic at the time of the procedure unless otherwise specified.    If you have any questions or concerns, please feel free to call the office at [phone] or send a Mychart message.   Thank you,  Staff at [facility]

## 2022-10-06 ENCOUNTER — Other Ambulatory Visit: Payer: Managed Care, Other (non HMO) | Admitting: Urology

## 2022-10-06 ENCOUNTER — Ambulatory Visit (HOSPITAL_BASED_OUTPATIENT_CLINIC_OR_DEPARTMENT_OTHER): Payer: Managed Care, Other (non HMO)

## 2022-10-13 ENCOUNTER — Encounter: Payer: Self-pay | Admitting: Family Medicine

## 2022-10-13 ENCOUNTER — Ambulatory Visit (INDEPENDENT_AMBULATORY_CARE_PROVIDER_SITE_OTHER): Payer: Managed Care, Other (non HMO) | Admitting: Family Medicine

## 2022-10-13 VITALS — BP 142/82 | HR 76 | Temp 98.2°F | Ht 68.0 in | Wt 253.0 lb

## 2022-10-13 DIAGNOSIS — R079 Chest pain, unspecified: Secondary | ICD-10-CM | POA: Diagnosis not present

## 2022-10-13 DIAGNOSIS — I10 Essential (primary) hypertension: Secondary | ICD-10-CM | POA: Diagnosis not present

## 2022-10-13 MED ORDER — HYDROCHLOROTHIAZIDE 25 MG PO TABS: 25.0000 mg | ORAL_TABLET | Freq: Every day | ORAL | 0 refills | Status: DC

## 2022-10-13 MED ORDER — AMLODIPINE BESYLATE 10 MG PO TABS: 10.0000 mg | ORAL_TABLET | Freq: Every day | ORAL | 0 refills | Status: DC

## 2022-10-13 NOTE — Progress Notes (Signed)
Established Patient Office Visit   Subjective:  Patient ID: Joe Mueller, male    DOB: [birth year]  Age: 62 y.o. MRN: [id number]  Chief Complaint  Patient presents with   Medical Management of Chronic Issues    2 month follow up on BP, no concerns.     HPI Encounter Diagnoses  Name Primary?   Essential hypertension Yes   Chest pain, unspecified type    For follow-up of hypertension.  BP at home is running about like it is today up in the 140s over 80s.  He is doing well with the pain.  No issues.  He is experienced some substernal chest pain but denies shortness of breath, dyspnea, nausea or diaphoresis.  Scheduled for prostate biopsy tomorrow.  Continues to enjoy several drinks of vodka daily.   Review of Systems  Constitutional: Negative.  Negative for diaphoresis.  HENT: Negative.    Eyes:  Negative for blurred vision, discharge and redness.  Respiratory: Negative.    Cardiovascular:  Positive for chest pain. Negative for palpitations.  Gastrointestinal:  Negative for abdominal pain and nausea.  Genitourinary: Negative.   Musculoskeletal: Negative.  Negative for myalgias.  Skin:  Negative for rash.  Neurological:  Negative for tingling, loss of consciousness and weakness.  Endo/Heme/Allergies:  Negative for polydipsia.     Current Outpatient Medications:    hydrochlorothiazide (HYDRODIURIL) 25 MG tablet, Take 1 tablet (25 mg total) by mouth daily., Disp: 90 tablet, Rfl: 0   amLODipine (NORVASC) 10 MG tablet, Take 1 tablet (10 mg total) by mouth daily., Disp: 90 tablet, Rfl: 0   Objective:     BP (!) 142/82 (BP Location: Left Arm, Patient Position: Sitting, Cuff Size: Large)   Pulse 76   Temp 98.2 F (36.8 C) (Temporal)   Ht 5\' 8"  (1.727 m)   Wt 253 lb (114.8 kg)   SpO2 97%   BMI 38.47 kg/m  BP Readings from Last 3 Encounters:  10/13/22 (!) 142/82  09/15/22 (!) 143/91  08/17/22 (!) 160/88   Wt Readings from Last 3 Encounters:  10/13/22 253 lb (114.8 kg)   09/15/22 251 lb (113.9 kg)  08/17/22 253 lb (114.8 kg)      Physical Exam Constitutional:      General: He is not in acute distress.    Appearance: Normal appearance. He is not ill-appearing, toxic-appearing or diaphoretic.  HENT:     Head: Normocephalic and atraumatic.     Right Ear: External ear normal.     Left Ear: External ear normal.     Mouth/Throat:     Mouth: Mucous membranes are moist.     Pharynx: Oropharynx is clear. No oropharyngeal exudate or posterior oropharyngeal erythema.  Eyes:     General: No scleral icterus.       Right eye: No discharge.        Left eye: No discharge.     Extraocular Movements: Extraocular movements intact.     Conjunctiva/sclera: Conjunctivae normal.     Pupils: Pupils are equal, round, and reactive to light.  Cardiovascular:     Rate and Rhythm: Normal rate and regular rhythm.  Pulmonary:     Effort: Pulmonary effort is normal. No respiratory distress.     Breath sounds: Normal breath sounds.  Musculoskeletal:     Cervical back: No rigidity or tenderness.     Right lower leg: No edema.     Left lower leg: No edema.  Skin:    General: Skin is warm  and dry.  Neurological:     Mental Status: He is alert and oriented to person, place, and time.  Psychiatric:        Mood and Affect: Mood normal.        Behavior: Behavior normal.      No results found for any visits on 10/13/22.    The 10-year ASCVD risk score (Arnett DK, et al., 2019) is: 13.9%    Assessment & Plan:   Essential hypertension -     hydroCHLOROthiazide; Take 1 tablet (25 mg total) by mouth daily.  Dispense: 90 tablet; Refill: 0 -     amLODIPine Besylate; Take 1 tablet (10 mg total) by mouth daily.  Dispense: 90 tablet; Refill: 0  Chest pain, unspecified type -     EKG 12-Lead    Return in about 4 weeks (around 11/10/2022).  Have added HCTZ.  Warned about diuretic effect.  He will start it 1 week after his prostate biopsy tomorrow.  We discussed some of  the things to expect after the biopsy.  Would not want HCTZ to complicate his recovery proceed.  Urged him to obtain a jigger to measure out his vodka and consume no more than 2 jiggers daily.  EKG today showed normal sinus rhythm with nonspecific T wave changes.  Libby Maw, MD

## 2022-10-14 ENCOUNTER — Ambulatory Visit (HOSPITAL_BASED_OUTPATIENT_CLINIC_OR_DEPARTMENT_OTHER)
Admission: RE | Admit: 2022-10-14 | Discharge: 2022-10-14 | Disposition: A | Discharge status: Home / Self Care | Payer: Managed Care, Other (non HMO) | Source: Ambulatory Visit | Attending: Urology | Admitting: Urology

## 2022-10-14 ENCOUNTER — Ambulatory Visit: Payer: Managed Care, Other (non HMO) | Admitting: Urology

## 2022-10-14 VITALS — BP 147/87 | HR 78

## 2022-10-14 DIAGNOSIS — R972 Elevated prostate specific antigen [PSA]: Secondary | ICD-10-CM | POA: Insufficient documentation

## 2022-10-14 DIAGNOSIS — Z2989 Encounter for other specified prophylactic measures: Secondary | ICD-10-CM | POA: Diagnosis not present

## 2022-10-14 DIAGNOSIS — N4231 Prostatic intraepithelial neoplasia: Secondary | ICD-10-CM

## 2022-10-14 DIAGNOSIS — R3129 Other microscopic hematuria: Secondary | ICD-10-CM | POA: Diagnosis not present

## 2022-10-14 LAB — URINALYSIS, ROUTINE W REFLEX MICROSCOPIC
Bilirubin, UA: NEGATIVE
Glucose, UA: NEGATIVE
Ketones, UA: NEGATIVE
Leukocytes,UA: NEGATIVE
Nitrite, UA: NEGATIVE
Specific Gravity, UA: 1.02 (ref 1.005–1.030)
Urobilinogen, Ur: 2 mg/dL — ABNORMAL HIGH (ref 0.2–1.0)
pH, UA: 7 (ref 5.0–7.5)

## 2022-10-14 LAB — MICROSCOPIC EXAMINATION
Bacteria, UA: NONE SEEN
Cast Type: NONE SEEN
Casts: NONE SEEN /LPF
Crystal Type: NONE SEEN
Crystals: NONE SEEN
Epithelial Cells (non renal): NONE SEEN /HPF (ref 0–10)
Mucus, UA: NONE SEEN
Renal Epithel, UA: NONE SEEN /HPF
Trichomonas, UA: NONE SEEN
WBC, UA: NONE SEEN /HPF (ref 0–5)
Yeast, UA: NONE SEEN

## 2022-10-14 MED ORDER — CEFTRIAXONE SODIUM 1 G IJ SOLR
1.0000 g | Freq: Once | INTRAMUSCULAR | Status: AC
  Administered 2022-10-14: 1 g via INTRAMUSCULAR

## 2022-10-14 NOTE — Addendum Note (Signed)
Addended by: Dema Severin on: 10/14/2022 03:28 PM   Modules accepted: Orders

## 2022-10-14 NOTE — Progress Notes (Signed)
   Assessment: 1. Elevated PSA   2. Microscopic hematuria     Plan: Per biopsy instructions.  Patient will return in 1 week to review pathology results and make further recommendations at that time.  Will also need hematuria evaluation.  Chief Complaint:  elevated psa   HPI: Joe Mueller is a 62 y.o. male who presents for continued evaluation of elevated PSA. Please see my note 09/15/2022 at the time of initial visit for detailed history and exam. PSA 07/2022 =6.04. DRE was difficult to feel due to body habitus.  I can only feel the apex.  This felt normal. Patient also has microhematuria (again noted on micro today).  Portions of the above documentation were copied from a prior visit for review purposes only.  Allergies: No Known Allergies  PMH: Past Medical History:  Diagnosis Date   Elevated BP without diagnosis of hypertension    Sleep apnea    CPAP    PSH: Past Surgical History:  Procedure Laterality Date   NO PAST SURGERIES      SH: Social History   Tobacco Use   Smoking status: Former    Packs/day: .25    Types: Cigarettes    Quit date: 11/01/2021    Years since quitting: 0.9   Smokeless tobacco: Never  Vaping Use   Vaping Use: Never used  Substance Use Topics   Alcohol use: Yes    Alcohol/week: 10.0 - 15.0 standard drinks of alcohol    Types: 10 - 15 Standard drinks or equivalent per week    Comment: 1 pint of gin per day x 20 years   Drug use: No    ROS: Constitutional:  Negative for fever, chills, weight loss CV: Negative for chest pain, previous MI, hypertension Respiratory:  Negative for shortness of breath, wheezing, sleep apnea, frequent cough GI:  Negative for nausea, vomiting, bloody stool, GERD  PE: BP (!) 147/87   Pulse 78  GENERAL APPEARANCE:  Well appearing, well developed, well nourished, NAD    TRANSRECTAL ULTRASOUND AND PROSTATE BIOPSY  Indication:  Elevated PSA  Prophylactic antibiotic administration: Rocephin  All  medications that could result in increased bleeding were discontinued within an appropriate period of the time of biopsy.  Risk including bleeding and infection were discussed.  Informed consent was obtained.  The patient was placed in the left lateral decubitus position.  PROCEDURE 1.  TRANSRECTAL ULTRASOUND OF THE PROSTATE  The 7 MHz transrectal probe was used to image the prostate.  Anal stenosis was not noted.  TRUS volume: 34.4 ml  Hypoechoic areas: None  Hyperechoic areas: None  Central calcifications: present at apex  Margins:  normal  Seminal Vesicles: normal   PROCEDURE 2:  PROSTATE BIOPSY  A periprostatic block was performed using 1% lidocaine and transrectal ultrasound guidance. Under transrectal ultrasound guidance, and using the Biopty gun, prostate biopsies were obtained systematically from the apex, mid gland, and base bilaterally.  A total of 12 cores were obtained.  Hemostasis was obtained with gentle pressure on the prostate.  The procedures were well-tolerated.  No significant bleeding was noted at the end of the procedure.  The patient was stable for discharge from the office.

## 2022-10-14 NOTE — Patient Instructions (Signed)
Prostate Biopsy Instructions  Stop all aspirin or blood thinners (aspirin, plavix, coumadin, warfarin, motrin, ibuprofen, advil, aleve, naproxen, naprosyn) for 7 days prior to the procedure.  If you have any questions about stopping these medications, please contact your primary care physician or cardiologist.  Having a light meal prior to the procedure is recommended.  If you are diabetic or have low blood sugar please bring a small snack or glucose tablet.  A Fleets enema is needed and can be purchased over the counter at a local pharmacy. This will need to be administered 2 hours prior to your procedure. Antibiotics will be administered in the clinic at the time of the procedure unless otherwise specified.    If you have any questions or concerns, please feel free to call the office at [phone] or send a Mychart message.   Thank you,  Staff at [facility]  

## 2022-10-19 ENCOUNTER — Encounter: Payer: Self-pay | Admitting: Urology

## 2022-10-21 ENCOUNTER — Encounter: Payer: Self-pay | Admitting: Urology

## 2022-10-21 ENCOUNTER — Ambulatory Visit (INDEPENDENT_AMBULATORY_CARE_PROVIDER_SITE_OTHER): Payer: Managed Care, Other (non HMO) | Admitting: Urology

## 2022-10-21 VITALS — BP 150/76 | HR 92

## 2022-10-21 DIAGNOSIS — R972 Elevated prostate specific antigen [PSA]: Secondary | ICD-10-CM

## 2022-10-21 DIAGNOSIS — R3129 Other microscopic hematuria: Secondary | ICD-10-CM

## 2022-10-21 LAB — URINALYSIS, ROUTINE W REFLEX MICROSCOPIC
Bilirubin, UA: NEGATIVE
Glucose, UA: NEGATIVE
Leukocytes,UA: NEGATIVE
Nitrite, UA: NEGATIVE
Specific Gravity, UA: 1.03 (ref 1.005–1.030)
Urobilinogen, Ur: 2 mg/dL — ABNORMAL HIGH (ref 0.2–1.0)
pH, UA: 6.5 (ref 5.0–7.5)

## 2022-10-21 LAB — MICROSCOPIC EXAMINATION
Cast Type: NONE SEEN
Casts: NONE SEEN /lpf
Crystal Type: NONE SEEN
Crystals: NONE SEEN
Epithelial Cells (non renal): NONE SEEN /hpf (ref 0–10)
RBC, Urine: >30 /hpf — AB (ref 0–2)
Renal Epithel, UA: NONE SEEN /hpf
Trichomonas, UA: NONE SEEN
WBC, UA: NONE SEEN /hpf (ref 0–5)
Yeast, UA: NONE SEEN

## 2022-10-21 NOTE — Progress Notes (Signed)
   Assessment: 1. Elevated PSA   2. Microscopic hematuria     Plan: Today had a long discussion with the patient and his wife and reviewed the prostate pathology with him which showed no evidence of malignancy.  We will continue to monitor his PSA and if there is any further rise or concern would then proceed with prostate MRI.  Also discussed his documented significant microscopic hematuria and recommendations for evaluation with upper tract imaging and endoscopy.  He is agreeable to proceed we will schedule CT and follow-up cystoscopy.   Chief Complaint: Prostate biopsy results  HPI: Joe Mueller is a 62 y.o. male who presents for continued evaluation of elevated PSA and microhematuria. Please see my note 09/15/2022 at the time of initial visit for detailed history and exam. PSA 07/2022 =6.04. DRE was difficult to feel due to body habitus.  I can only feel the apex.  This felt normal. Patient also has microhematuria   Status post TRUS/BX 09/2022--pathology benign.  There was 1 single core with focal PIN Prostate volume 34.4   Portions of the above documentation were copied from a prior visit for review purposes only.  Allergies: No Known Allergies  PMH: Past Medical History:  Diagnosis Date   Elevated BP without diagnosis of hypertension    Sleep apnea    CPAP    PSH: Past Surgical History:  Procedure Laterality Date   NO PAST SURGERIES      SH: Social History   Tobacco Use   Smoking status: Former    Packs/day: .25    Types: Cigarettes    Quit date: 11/01/2021    Years since quitting: 0.9   Smokeless tobacco: Never  Vaping Use   Vaping Use: Never used  Substance Use Topics   Alcohol use: Yes    Alcohol/week: 10.0 - 15.0 standard drinks of alcohol    Types: 10 - 15 Standard drinks or equivalent per week    Comment: 1 pint of gin per day x 20 years   Drug use: No    ROS: Constitutional:  Negative for fever, chills, weight loss CV: Negative for chest  pain, previous MI, hypertension Respiratory:  Negative for shortness of breath, wheezing, sleep apnea, frequent cough GI:  Negative for nausea, vomiting, bloody stool, GERD  PE: BP (!) 150/76   Pulse 92  GENERAL APPEARANCE:  Well appearing, well developed, well nourished, NAD

## 2022-11-02 ENCOUNTER — Telehealth (HOSPITAL_BASED_OUTPATIENT_CLINIC_OR_DEPARTMENT_OTHER): Payer: Self-pay

## 2022-11-05 ENCOUNTER — Telehealth (HOSPITAL_BASED_OUTPATIENT_CLINIC_OR_DEPARTMENT_OTHER): Payer: Self-pay

## 2022-11-10 ENCOUNTER — Ambulatory Visit: Payer: 59 | Admitting: Family Medicine

## 2022-11-10 ENCOUNTER — Telehealth: Payer: Self-pay | Admitting: Family Medicine

## 2022-11-10 NOTE — Telephone Encounter (Signed)
4.23.24 no show letter sent

## 2022-11-12 NOTE — Telephone Encounter (Signed)
1st no show, fee waived, letter and text sent  

## 2022-11-18 ENCOUNTER — Other Ambulatory Visit: Payer: 59 | Admitting: Urology

## 2022-11-18 ENCOUNTER — Ambulatory Visit (HOSPITAL_BASED_OUTPATIENT_CLINIC_OR_DEPARTMENT_OTHER): Admission: RE | Admit: 2022-11-18 | Payer: 59 | Source: Ambulatory Visit

## 2023-01-25 ENCOUNTER — Other Ambulatory Visit: Payer: Self-pay | Admitting: Family Medicine

## 2023-01-25 DIAGNOSIS — I10 Essential (primary) hypertension: Secondary | ICD-10-CM

## 2023-02-24 ENCOUNTER — Other Ambulatory Visit: Payer: Self-pay

## 2023-02-24 ENCOUNTER — Telehealth: Payer: Self-pay | Admitting: Family Medicine

## 2023-02-24 DIAGNOSIS — I10 Essential (primary) hypertension: Secondary | ICD-10-CM

## 2023-02-24 MED ORDER — HYDROCHLOROTHIAZIDE 25 MG PO TABS
25.0000 mg | ORAL_TABLET | Freq: Every day | ORAL | 0 refills | Status: DC
Start: 2023-02-24 — End: 2023-03-08

## 2023-02-24 NOTE — Telephone Encounter (Signed)
Pt called upset because he will run out of hydrochlorothiazide (HYDRODIURIL) 25 MG tablet [[id number]] before his appt on the 19th. He would like enough to get him by until.

## 2023-03-08 ENCOUNTER — Ambulatory Visit (INDEPENDENT_AMBULATORY_CARE_PROVIDER_SITE_OTHER): Payer: 59 | Admitting: Family Medicine

## 2023-03-08 ENCOUNTER — Encounter: Payer: Self-pay | Admitting: Family Medicine

## 2023-03-08 DIAGNOSIS — I10 Essential (primary) hypertension: Secondary | ICD-10-CM | POA: Diagnosis not present

## 2023-03-08 MED ORDER — AMLODIPINE BESYLATE 10 MG PO TABS: 10.0000 mg | ORAL_TABLET | Freq: Every day | ORAL | 0 refills | Status: DC

## 2023-03-08 MED ORDER — TRIAMTERENE-HCTZ 37.5-25 MG PO CAPS
1.0000 | ORAL_CAPSULE | Freq: Every day | ORAL | 0 refills | Status: DC
Start: 2023-03-08 — End: 2023-08-02

## 2023-03-08 NOTE — Progress Notes (Signed)
Established Patient Office Visit   Subjective:  Patient ID: Joe Mueller, male    DOB: Apr 25, 1961  Age: 62 y.o. MRN: [id number]  Chief Complaint  Patient presents with   Medical Management of Chronic Issues    Follow up. Pt is not fasting ate 1 hour ago.     HPI Encounter Diagnoses  Name Primary?   Essential hypertension    Doing well.  For follow-up of hypertension.  Compliant with amlodipine and HCTZ.  No issues taking them.  Continues follow-up with urology.    Review of Systems  Constitutional: Negative.   HENT: Negative.    Eyes:  Negative for blurred vision, discharge and redness.  Respiratory: Negative.    Cardiovascular: Negative.   Gastrointestinal:  Negative for abdominal pain.  Genitourinary: Negative.   Musculoskeletal: Negative.  Negative for myalgias.  Skin:  Negative for rash.  Neurological:  Negative for tingling, loss of consciousness, weakness and headaches.  Endo/Heme/Allergies:  Negative for polydipsia.     Current Outpatient Medications:    amLODipine (NORVASC) 10 MG tablet, Take 1 tablet (10 mg total) by mouth daily., Disp: 90 tablet, Rfl: 0   triamterene-hydrochlorothiazide (DYAZIDE) 37.5-25 MG capsule, Take 1 each (1 capsule total) by mouth daily., Disp: 90 capsule, Rfl: 0   Objective:     BP 136/86   Pulse (!) 110   Temp 98.2 F (36.8 C)   Ht 5\' 8"  (1.727 m)   Wt 241 lb 3.2 oz (109.4 kg)   SpO2 97%   BMI 36.67 kg/m  BP Readings from Last 3 Encounters:  03/08/23 136/86  10/21/22 (!) 150/76  10/14/22 (!) 147/87   Wt Readings from Last 3 Encounters:  03/08/23 241 lb 3.2 oz (109.4 kg)  10/13/22 253 lb (114.8 kg)  09/15/22 251 lb (113.9 kg)      Physical Exam Constitutional:      General: He is not in acute distress.    Appearance: Normal appearance. He is not ill-appearing, toxic-appearing or diaphoretic.  HENT:     Head: Normocephalic and atraumatic.     Right Ear: External ear normal.     Left Ear: External ear normal.   Eyes:     General: No scleral icterus.       Right eye: No discharge.        Left eye: No discharge.     Extraocular Movements: Extraocular movements intact.     Conjunctiva/sclera: Conjunctivae normal.  Pulmonary:     Effort: Pulmonary effort is normal. No respiratory distress.  Skin:    General: Skin is warm and dry.  Neurological:     Mental Status: He is alert and oriented to person, place, and time.  Psychiatric:        Mood and Affect: Mood normal.        Behavior: Behavior normal.      No results found for any visits on 03/08/23.    The 10-year ASCVD risk score (Arnett DK, et al., 2019) is: 13.4%    Assessment & Plan:   Essential hypertension -     amLODIPine Besylate; Take 1 tablet (10 mg total) by mouth daily.  Dispense: 90 tablet; Refill: 0 -     Triamterene-HCTZ; Take 1 each (1 capsule total) by mouth daily.  Dispense: 90 capsule; Refill: 0    Return in about 11 weeks (around 05/24/2023).  Will continue amlodipine 10 mg daily.  Have switched Maxide and discontinued HCTZ alone.  Will obtain a cuff and check and  record his pressures periodically.  Information given on managing hypertension.  Mliss Sax, MD

## 2023-05-25 ENCOUNTER — Ambulatory Visit (INDEPENDENT_AMBULATORY_CARE_PROVIDER_SITE_OTHER): Payer: 59 | Admitting: Family Medicine

## 2023-05-25 ENCOUNTER — Encounter: Payer: Self-pay | Admitting: Family Medicine

## 2023-05-25 VITALS — BP 128/82 | HR 100 | Temp 98.1°F | Ht 68.0 in | Wt 233.4 lb

## 2023-05-25 DIAGNOSIS — Z0000 Encounter for general adult medical examination without abnormal findings: Secondary | ICD-10-CM | POA: Diagnosis not present

## 2023-05-25 DIAGNOSIS — I10 Essential (primary) hypertension: Secondary | ICD-10-CM

## 2023-05-25 DIAGNOSIS — Z131 Encounter for screening for diabetes mellitus: Secondary | ICD-10-CM

## 2023-05-25 DIAGNOSIS — Z13220 Encounter for screening for lipoid disorders: Secondary | ICD-10-CM | POA: Diagnosis not present

## 2023-05-25 DIAGNOSIS — Z1211 Encounter for screening for malignant neoplasm of colon: Secondary | ICD-10-CM

## 2023-05-25 NOTE — Progress Notes (Signed)
Established Patient Office Visit   Subjective:  Patient ID: Joe Mueller, male    DOB: January 13, 1961  Age: 62 y.o. MRN: [id number]  Chief Complaint  Patient presents with   Medical Management of Chronic Issues    11 week follow up. Pt is not fasting. PT states bp are running normal.     HPI Encounter Diagnoses  Name Primary?   Healthcare maintenance Yes   Essential hypertension    Screening for colon cancer    Screening for diabetes mellitus    Screening for cholesterol level    For a physical and follow-up of above.  Doing well.  Has been able to lose about 20 pounds since seen back in March.  Blood pressure at home has been running 120s over 80s.  Tolerating amlodipine and Dyazide well.  Suspect status post workup for elevated PSA with negative biopsies.  Urinalysis per urology showed hematuria.  He is scheduled for workup.  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  He does not have regular dental care.  Chart reviewed and showed that an A1c was at 6.36 years ago.   Review of Systems  Constitutional: Negative.   HENT: Negative.    Eyes:  Negative for blurred vision, discharge and redness.  Respiratory: Negative.    Cardiovascular: Negative.   Gastrointestinal:  Negative for abdominal pain.  Genitourinary: Negative.   Musculoskeletal: Negative.  Negative for myalgias.  Skin:  Negative for rash.  Neurological:  Negative for tingling, loss of consciousness and weakness.  Endo/Heme/Allergies:  Negative for polydipsia.     Current Outpatient Medications:    amLODipine (NORVASC) 10 MG tablet, Take 1 tablet (10 mg total) by mouth daily., Disp: 90 tablet, Rfl: 0   triamterene-hydrochlorothiazide (DYAZIDE) 37.5-25 MG capsule, Take 1 each (1 capsule total) by mouth daily., Disp: 90 capsule, Rfl: 0   Objective:     BP 128/82   Pulse 100   Temp 98.1 F (36.7 C)   Ht 5\' 8"  (1.727 m)   Wt 233 lb 6.4 oz (105.9 kg)   SpO2 98%   BMI  35.49 kg/m  BP Readings from Last 3 Encounters:  05/25/23 128/82  03/08/23 136/86  10/21/22 (!) 150/76   Wt Readings from Last 3 Encounters:  05/25/23 233 lb 6.4 oz (105.9 kg)  03/08/23 241 lb 3.2 oz (109.4 kg)  10/13/22 253 lb (114.8 kg)      Physical Exam Constitutional:      General: He is not in acute distress.    Appearance: Normal appearance. He is not ill-appearing, toxic-appearing or diaphoretic.  HENT:     Head: Normocephalic and atraumatic.     Right Ear: Tympanic membrane, ear canal and external ear normal.     Left Ear: Tympanic membrane, ear canal and external ear normal.     Mouth/Throat:     Mouth: Mucous membranes are moist.     Pharynx: Oropharynx is clear. No oropharyngeal exudate or posterior oropharyngeal erythema.  Eyes:     General: No scleral icterus.       Right eye: No discharge.        Left eye: No discharge.     Extraocular Movements: Extraocular movements intact.     Conjunctiva/sclera: Conjunctivae normal.     Pupils: Pupils are equal, round, and reactive to light.  Cardiovascular:     Rate and Rhythm: Normal rate and regular rhythm.  Pulmonary:     Effort: Pulmonary  effort is normal. No respiratory distress.     Breath sounds: Normal breath sounds.  Abdominal:     General: Bowel sounds are normal.     Tenderness: There is no right CVA tenderness or left CVA tenderness.  Musculoskeletal:     Cervical back: No rigidity or tenderness.  Skin:    General: Skin is warm and dry.  Neurological:     Mental Status: He is alert and oriented to person, place, and time.  Psychiatric:        Mood and Affect: Mood normal.        Behavior: Behavior normal.      No results found for any visits on 05/25/23.    The 10-year ASCVD risk score (Arnett DK, et al., 2019) is: 12%    Assessment & Plan:   Healthcare maintenance  Essential hypertension -     CBC; Future -     Comprehensive metabolic panel; Future  Screening for colon cancer -      Ambulatory referral to Gastroenterology  Screening for diabetes mellitus -     Hemoglobin A1c; Future  Screening for cholesterol level -     Lipid panel; Future    Return in about 6 months (around 11/22/2023), or Please return fasting for above ordered blood work.  Information was given on health maintenance and disease prevention.  Encouraged dental care.  Will follow back up with urology for hematuria.  Referred for colonoscopy.  Continue amlodipine and Dyazide.  Information given on managing hypertension.  We discussed that with enough weight loss we could discontinue blood pressure medications.   Mliss Sax, MD

## 2023-06-25 ENCOUNTER — Other Ambulatory Visit (INDEPENDENT_AMBULATORY_CARE_PROVIDER_SITE_OTHER): Payer: 59

## 2023-06-25 DIAGNOSIS — Z13220 Encounter for screening for lipoid disorders: Secondary | ICD-10-CM | POA: Diagnosis not present

## 2023-06-25 DIAGNOSIS — Z131 Encounter for screening for diabetes mellitus: Secondary | ICD-10-CM | POA: Diagnosis not present

## 2023-06-25 DIAGNOSIS — I10 Essential (primary) hypertension: Secondary | ICD-10-CM

## 2023-06-25 LAB — COMPREHENSIVE METABOLIC PANEL
ALT: 28 U/L (ref 0–53)
AST: 43 U/L — ABNORMAL HIGH (ref 0–37)
Albumin: 4.5 g/dL (ref 3.5–5.2)
Alkaline Phosphatase: 60 U/L (ref 39–117)
BUN: 13 mg/dL (ref 6–23)
CO2: 30 meq/L (ref 19–32)
Calcium: 9.6 mg/dL (ref 8.4–10.5)
Chloride: 100 meq/L (ref 96–112)
Creatinine, Ser: 0.78 mg/dL (ref 0.40–1.50)
GFR: 96.93 mL/min (ref >60.00)
Glucose, Bld: 78 mg/dL (ref 70–99)
Potassium: 4.2 meq/L (ref 3.5–5.1)
Sodium: 138 meq/L (ref 135–145)
Total Bilirubin: 0.4 mg/dL (ref 0.2–1.2)
Total Protein: 7.9 g/dL (ref 6.0–8.3)

## 2023-06-25 LAB — CBC
HCT: 43.6 % (ref 39.0–52.0)
Hemoglobin: 14.5 g/dL (ref 13.0–17.0)
MCHC: 33.3 g/dL (ref 30.0–36.0)
MCV: 102 fL — ABNORMAL HIGH (ref 78.0–100.0)
Platelets: 308 10*3/uL (ref 150.0–400.0)
RBC: 4.27 Mil/uL (ref 4.22–5.81)
RDW: 13.3 % (ref 11.5–15.5)
WBC: 5.7 10*3/uL (ref 4.0–10.5)

## 2023-06-25 LAB — HEMOGLOBIN A1C: Hgb A1c MFr Bld: 6.2 % (ref 4.6–6.5)

## 2023-06-25 LAB — LIPID PANEL
Cholesterol: 179 mg/dL (ref 0–200)
HDL: 78.5 mg/dL (ref >39.00)
LDL Cholesterol: 88 mg/dL (ref 0–99)
NonHDL: 100.39
Total CHOL/HDL Ratio: 2
Triglycerides: 64 mg/dL (ref 0.0–149.0)
VLDL: 12.8 mg/dL (ref 0.0–40.0)

## 2023-07-30 ENCOUNTER — Telehealth: Payer: Self-pay | Admitting: Family Medicine

## 2023-07-30 NOTE — Telephone Encounter (Signed)
 Patient is needing a call back regarding what meds he is suppose to be on

## 2023-08-02 ENCOUNTER — Other Ambulatory Visit: Payer: Self-pay

## 2023-08-02 DIAGNOSIS — I10 Essential (primary) hypertension: Secondary | ICD-10-CM

## 2023-08-02 MED ORDER — AMLODIPINE BESYLATE 10 MG PO TABS: 10.0000 mg | ORAL_TABLET | Freq: Every day | ORAL | 0 refills | Status: DC

## 2023-08-02 MED ORDER — TRIAMTERENE-HCTZ 37.5-25 MG PO CAPS: 1.0000 | ORAL_CAPSULE | Freq: Every day | ORAL | 0 refills | Status: DC

## 2023-08-02 NOTE — Telephone Encounter (Unsigned)
 Copied from CRM [id number]. Topic: General - Other >> Jul 30, 2023  3:46 PM Truddie Crumble wrote: Reason for CRM: Pt is returning a call to the office regarding his medication not being approved and wanting to see what medication he need to be on

## 2023-11-05 ENCOUNTER — Other Ambulatory Visit: Payer: Self-pay | Admitting: Family Medicine

## 2023-11-05 DIAGNOSIS — I10 Essential (primary) hypertension: Secondary | ICD-10-CM

## 2023-11-05 NOTE — Telephone Encounter (Signed)
 Copied from CRM [id number]. Topic: Clinical - Medication Refill >> Nov 05, 2023  3:06 PM Orien Bird wrote: Most Recent Primary Care Visit:  Provider: [facility]  Department: [facility]  Visit Type: LAB  Date: 06/25/2023  Medication: amLODipine  (NORVASC ) 10 MG tablet, triamterene -hydrochlorothiazide  (DYAZIDE) 37.5-25 MG capsule    Has the patient contacted their pharmacy? Yes (Agent: If no, request that the patient contact the pharmacy for the refill. If patient does not wish to contact the pharmacy document the reason why and proceed with request.) (Agent: If yes, when and what did the pharmacy advise?)  Is this the correct pharmacy for this prescription? Yes If no, delete pharmacy and type the correct one.  This is the patient's preferred pharmacy:  [facility] [address], [place], [place] [address]  Has the prescription been filled recently? No  Is the patient out of the medication? Yes  Has the patient been seen for an appointment in the last year OR does the patient have an upcoming appointment? Yes  Can we respond through MyChart? Yes  Agent: Please be advised that Rx refills may take up to 3 business days. We ask that you follow-up with your pharmacy.

## 2023-11-08 MED ORDER — TRIAMTERENE-HCTZ 37.5-25 MG PO CAPS
1.0000 | ORAL_CAPSULE | Freq: Every day | ORAL | 0 refills | Status: AC
Start: 2023-11-08 — End: ?

## 2023-11-08 MED ORDER — AMLODIPINE BESYLATE 10 MG PO TABS
10.0000 mg | ORAL_TABLET | Freq: Every day | ORAL | 0 refills | Status: AC
Start: 2023-11-08 — End: ?
